# Patient Record
Sex: Female | Born: 1935 | Race: White | Hispanic: No | Smoking: Never smoker
Health system: Southern US, Community
[De-identification: ages and names within clinical notes are randomized; demographics above are authoritative.]

## PROBLEM LIST (undated history)

## (undated) DIAGNOSIS — D1800 Hemangioma unspecified site: Secondary | ICD-10-CM

## (undated) DIAGNOSIS — C801 Malignant (primary) neoplasm, unspecified: Secondary | ICD-10-CM

## (undated) DIAGNOSIS — Z8669 Personal history of other diseases of the nervous system and sense organs: Secondary | ICD-10-CM

## (undated) DIAGNOSIS — K449 Diaphragmatic hernia without obstruction or gangrene: Secondary | ICD-10-CM

## (undated) DIAGNOSIS — E538 Deficiency of other specified B group vitamins: Secondary | ICD-10-CM

## (undated) DIAGNOSIS — E7800 Pure hypercholesterolemia, unspecified: Secondary | ICD-10-CM

## (undated) DIAGNOSIS — M810 Age-related osteoporosis without current pathological fracture: Secondary | ICD-10-CM

## (undated) DIAGNOSIS — Z9889 Other specified postprocedural states: Secondary | ICD-10-CM

## (undated) HISTORY — DX: Other specified postprocedural states: Z98.890

## (undated) HISTORY — DX: Malignant (primary) neoplasm, unspecified: C80.1

## (undated) HISTORY — DX: Diaphragmatic hernia without obstruction or gangrene: K44.9

## (undated) HISTORY — PX: BREAST BIOPSY: SHX20

## (undated) HISTORY — PX: SKIN CANCER EXCISION: SHX779

## (undated) HISTORY — DX: Age-related osteoporosis without current pathological fracture: M81.0

## (undated) HISTORY — DX: Deficiency of other specified B group vitamins: E53.8

## (undated) HISTORY — DX: Pure hypercholesterolemia, unspecified: E78.00

## (undated) HISTORY — PX: EYE SURGERY: SHX253

## (undated) HISTORY — DX: Personal history of other diseases of the nervous system and sense organs: Z86.69

## (undated) HISTORY — DX: Hemangioma unspecified site: D18.00

---

## 1973-12-12 HISTORY — PX: ABDOMINAL HYSTERECTOMY: SHX81

## 1990-03-12 HISTORY — PX: CHOLECYSTECTOMY: SHX55

## 2005-10-03 ENCOUNTER — Ambulatory Visit: Payer: Self-pay | Admitting: Internal Medicine

## 2006-10-04 ENCOUNTER — Ambulatory Visit: Payer: Self-pay | Admitting: Internal Medicine

## 2007-12-27 ENCOUNTER — Ambulatory Visit: Payer: Self-pay | Admitting: Internal Medicine

## 2008-12-31 ENCOUNTER — Ambulatory Visit: Payer: Self-pay | Admitting: Internal Medicine

## 2009-03-07 ENCOUNTER — Emergency Department: Payer: Self-pay | Admitting: Emergency Medicine

## 2009-03-08 ENCOUNTER — Emergency Department: Payer: Self-pay | Admitting: Emergency Medicine

## 2009-03-16 ENCOUNTER — Ambulatory Visit: Payer: Self-pay | Admitting: Internal Medicine

## 2009-03-18 ENCOUNTER — Ambulatory Visit: Payer: Self-pay | Admitting: Internal Medicine

## 2009-04-15 ENCOUNTER — Ambulatory Visit: Payer: Self-pay | Admitting: Neurology

## 2009-08-21 ENCOUNTER — Ambulatory Visit: Payer: Self-pay | Admitting: Internal Medicine

## 2009-09-01 ENCOUNTER — Ambulatory Visit: Payer: Self-pay | Admitting: Internal Medicine

## 2009-09-11 ENCOUNTER — Ambulatory Visit: Payer: Self-pay | Admitting: Oncology

## 2009-09-25 ENCOUNTER — Ambulatory Visit: Payer: Self-pay | Admitting: Oncology

## 2009-10-12 ENCOUNTER — Ambulatory Visit: Payer: Self-pay | Admitting: Oncology

## 2010-01-07 LAB — HM DEXA SCAN

## 2010-01-19 ENCOUNTER — Ambulatory Visit: Payer: Self-pay | Admitting: Internal Medicine

## 2010-04-01 IMAGING — CT CT CHEST W/O CM
1 of 2 series · 14 of 32 positions shown, 18 images · non-contrast
Comparison: none

REASON FOR EXAM: abnormal chest xray  rounded density left lung eval lung
parenchyma  DR SPEC...
COMMENTS:

[Series 2: soft tissue · axial · 0.50mm/px · z∈[+274,+549]mm · 14 of 65 slices shown, 18 images]
[im 5/65  mediastinal]
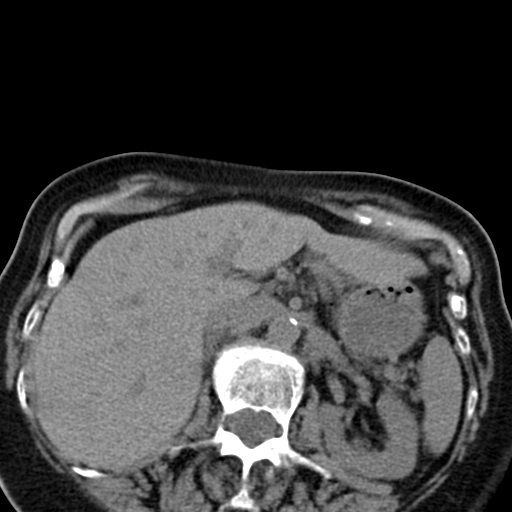
[im 5/65  lung]
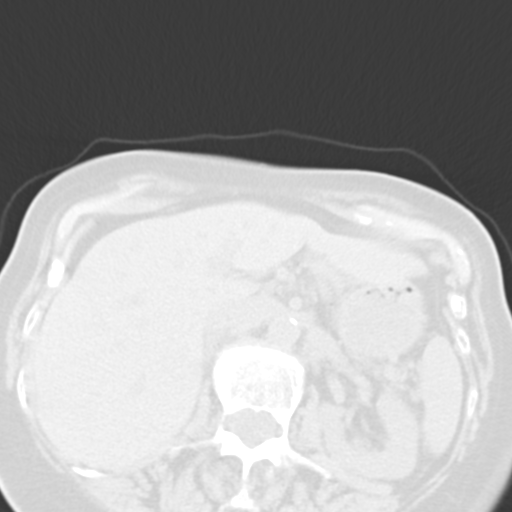
[im 10/65  lung]
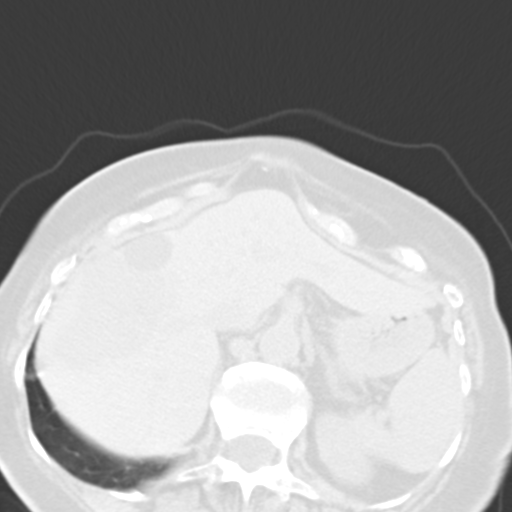
[im 15/65  lung]
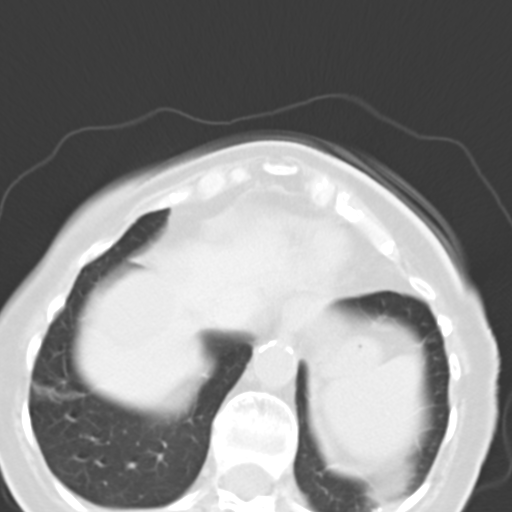
[im 20/65  lung]
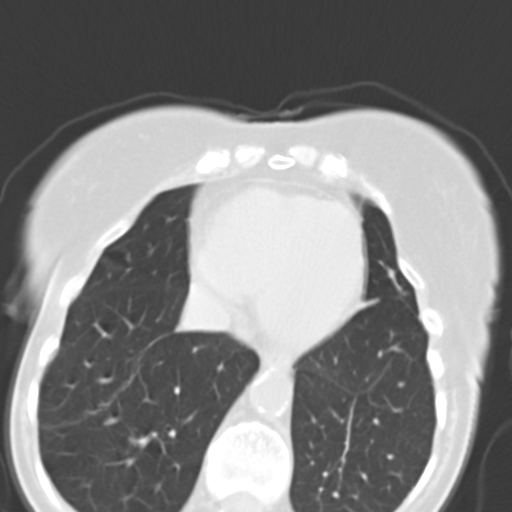
[im 25/65  mediastinal]
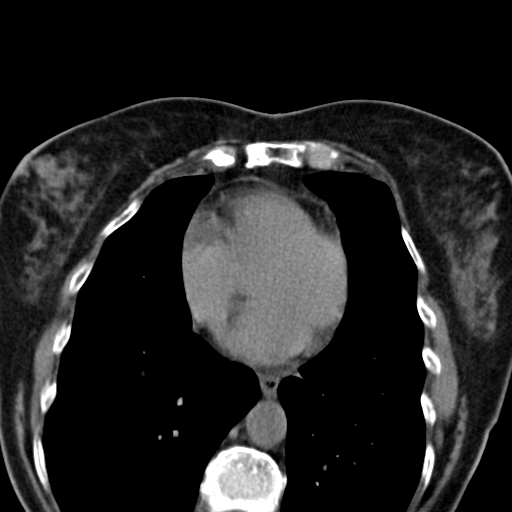
[im 25/65  lung]
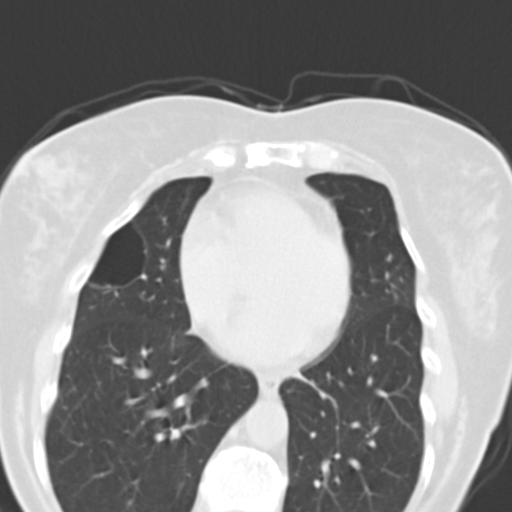
[im 30/65  lung]
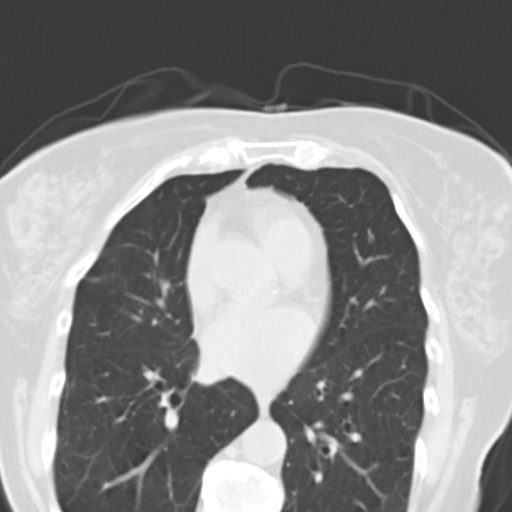
[im 31/65  lung]
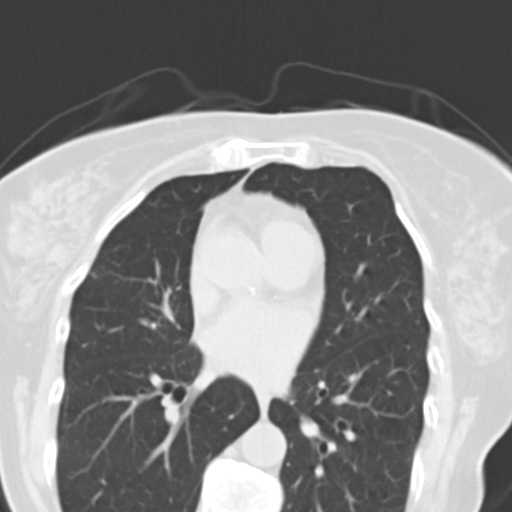
[im 33/65  lung]
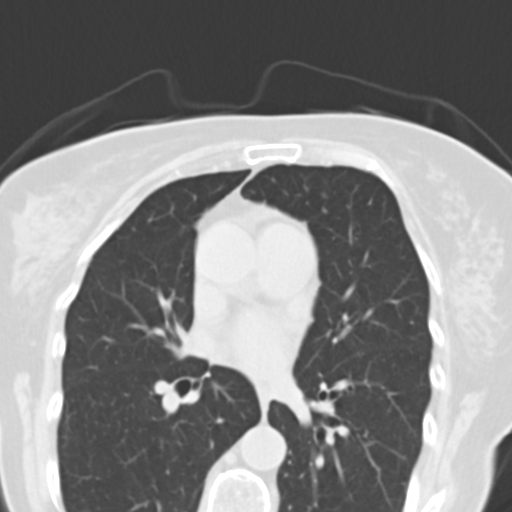
[im 35/65  mediastinal]
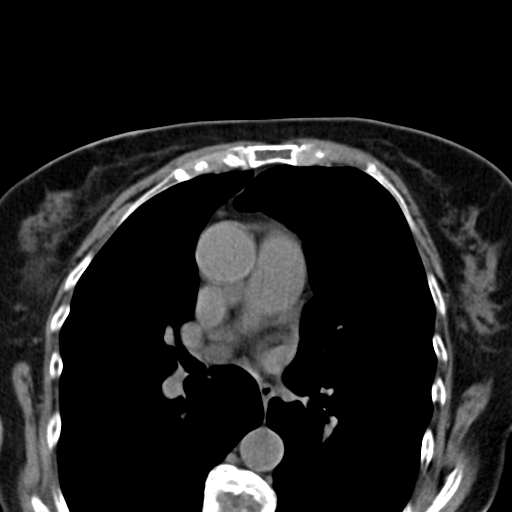
[im 35/65  lung]
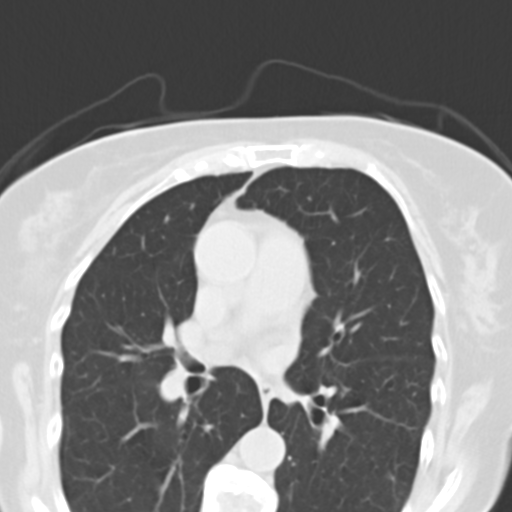
[im 40/65  lung]
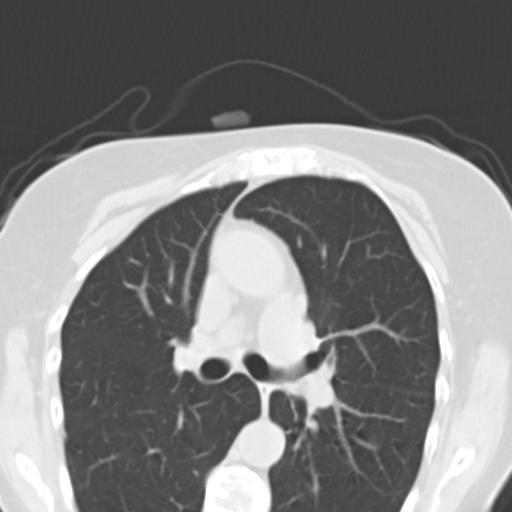
[im 45/65  lung]
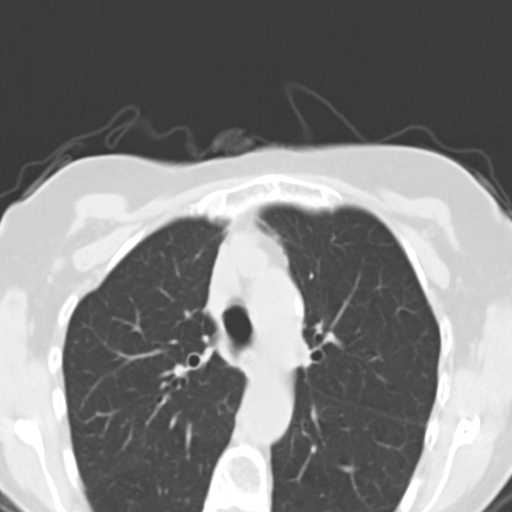
[im 50/65  lung]
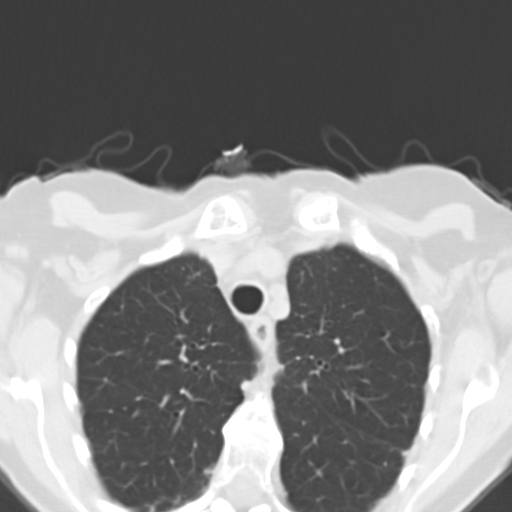
[im 55/65  mediastinal]
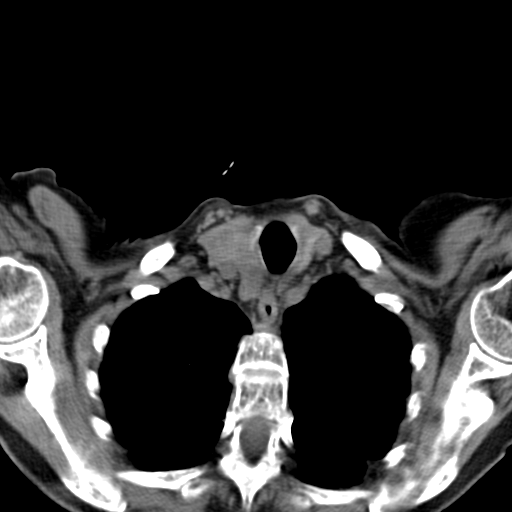
[im 55/65  lung]
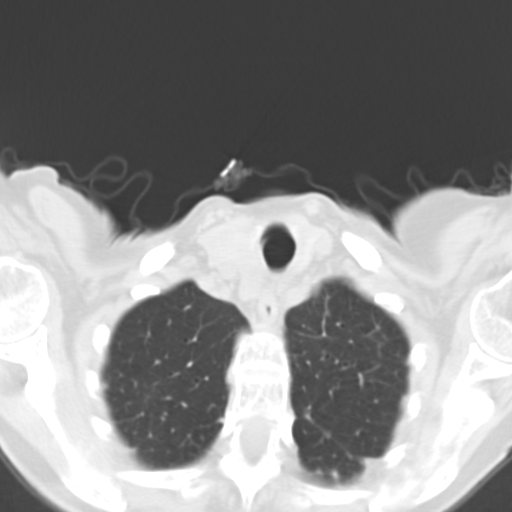
[im 60/65  lung]
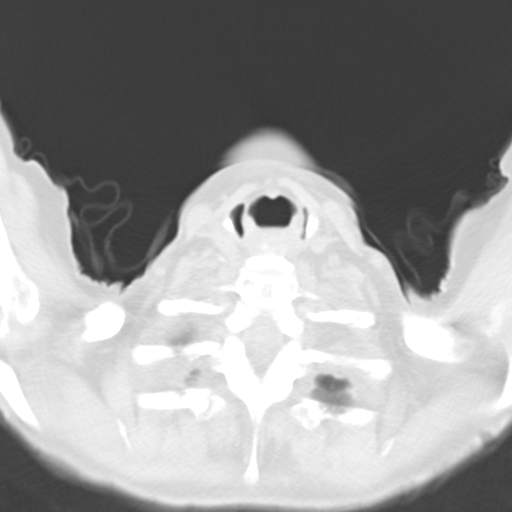

[14 of 32 positions shown; findings below may reference images not displayed]

PROCEDURE:     CT  - CT CHEST WITHOUT CONTRAST  - August 21, 2009 [DATE]

RESULT:     Axial CT scanning was performed through the chest without
contrast as requested. Review of 3-dimensional reconstructed images was
performed separately on the [employer] monitor.

At lung window settings there are emphysematous changes bilaterally. There
is apical pleural scarring and nodularity bilaterally. There are subtle
areas of nodularity associated with the major fissure on the left on image
28 and on the right along the major fissure on image 24. This latter nodule
is faintly calcified.

The cardiac chambers are normal in size. No pathologic sized mediastinal or
hilar lymph nodes are seen. The right thyroid lobe is enlarged. There is
prominent thoracic kyphosis.

Within the abdomen there are hypodensities in the right lobe of the liver. A
smaller lower density lesion present anteriorly measures 2.4 x 1.8 cm and
has Hounsfield measurement of 7. A larger less hypo-dense structure measures
5.6 x 5.5 cm and has Hounsfield measurement of approximately 35.
IMPRESSION: 1. There are emphysematous changes in both lungs. There are scattered
subcentimeter nodules some of which are calcified which are chiefly
associated with the pleural surfaces. There is apical pleural scarring as
well.
2. There is no evidence of CHF nor pneumonia. I see no lymphadenopathy.
3. There are nonspecific hypodensities within the liver for which further
evaluation is recommended. Ultrasound could be considered initially.
However, more definitive evaluation with a triphasic CT scan or MRI of the
liver would be useful.

## 2011-03-31 ENCOUNTER — Ambulatory Visit: Payer: Self-pay | Admitting: Internal Medicine

## 2012-04-04 ENCOUNTER — Ambulatory Visit: Payer: Self-pay | Admitting: Internal Medicine

## 2012-10-16 ENCOUNTER — Ambulatory Visit: Payer: Self-pay | Admitting: Internal Medicine

## 2012-10-16 ENCOUNTER — Encounter: Payer: Self-pay | Admitting: Internal Medicine

## 2012-10-16 NOTE — Progress Notes (Signed)
  Subjective:    Patient ID: Denise Garrison, female    DOB: 07/13/1923, 78 y.o.   MRN: [id number]  HPI    Review of Systems     Objective:   Physical Exam        Assessment & Plan:  Pt cancelled appt.  Pt information was abstracted into the system.

## 2012-10-30 ENCOUNTER — Encounter: Payer: Self-pay | Admitting: Internal Medicine

## 2012-10-30 ENCOUNTER — Ambulatory Visit (INDEPENDENT_AMBULATORY_CARE_PROVIDER_SITE_OTHER): Payer: PRIVATE HEALTH INSURANCE | Admitting: Internal Medicine

## 2012-10-30 VITALS — BP 130/70 | HR 82 | Temp 98.4°F | Ht 64.0 in | Wt 107.0 lb

## 2012-10-30 DIAGNOSIS — E7800 Pure hypercholesterolemia, unspecified: Secondary | ICD-10-CM

## 2012-10-30 DIAGNOSIS — F09 Unspecified mental disorder due to known physiological condition: Secondary | ICD-10-CM

## 2012-10-30 DIAGNOSIS — R634 Abnormal weight loss: Secondary | ICD-10-CM

## 2012-10-30 DIAGNOSIS — M810 Age-related osteoporosis without current pathological fracture: Secondary | ICD-10-CM

## 2012-10-30 DIAGNOSIS — R7309 Other abnormal glucose: Secondary | ICD-10-CM

## 2012-10-30 DIAGNOSIS — E538 Deficiency of other specified B group vitamins: Secondary | ICD-10-CM

## 2012-10-30 DIAGNOSIS — R739 Hyperglycemia, unspecified: Secondary | ICD-10-CM

## 2012-10-30 LAB — CBC WITH DIFFERENTIAL/PLATELET
Basophils Absolute: 0 10*3/uL (ref 0.0–0.1)
Eosinophils Absolute: 0 10*3/uL (ref 0.0–0.7)
HCT: 41.4 % (ref 36.0–46.0)
Lymphs Abs: 1.2 10*3/uL (ref 0.7–4.0)
MCHC: 31.8 g/dL (ref 30.0–36.0)
MCV: 88.4 fl (ref 78.0–100.0)
Monocytes Absolute: 0.6 10*3/uL (ref 0.1–1.0)
Platelets: 251 10*3/uL (ref 150.0–400.0)
RDW: 15 % — ABNORMAL HIGH (ref 11.5–14.6)

## 2012-10-30 LAB — COMPREHENSIVE METABOLIC PANEL
ALT: 13 U/L (ref 0–35)
Alkaline Phosphatase: 50 U/L (ref 39–117)
Creatinine, Ser: 0.7 mg/dL (ref 0.4–1.2)
Sodium: 139 mEq/L (ref 135–145)
Total Bilirubin: 0.5 mg/dL (ref 0.3–1.2)
Total Protein: 6.6 g/dL (ref 6.0–8.3)

## 2012-10-30 NOTE — Patient Instructions (Addendum)
It was nice seeing you today.  I am glad you have been feeling well.  Let me know if you need anything.

## 2012-10-31 ENCOUNTER — Telehealth: Payer: Self-pay | Admitting: Internal Medicine

## 2012-10-31 NOTE — Telephone Encounter (Signed)
Daughter in law called in and stated that patient is out of B12 medication and also would like for you to call her to discuss another medication that she was previously taken for memory she states patient didn't take like she was suppose to. Not sure if she should get that filled either.   Please advise.

## 2012-10-31 NOTE — Progress Notes (Signed)
Called and gave lab results to patient. Scheduled appointment for 9:00am 02/25/13.

## 2012-11-01 ENCOUNTER — Telehealth: Payer: Self-pay | Admitting: *Deleted

## 2012-11-01 NOTE — Telephone Encounter (Signed)
Spoke to patients daughter-n-law, she wanted to know if you wanted to continue to keep patient on b-12 inj. If so the patient will need a script called in for them. She also wanted to know if you would give patient a referral to Dermatologist, that patient saw at [facility] once before. She also wanted you to be aware that patient is know longer taking the medication that was prescribed for memory.

## 2012-11-05 NOTE — Telephone Encounter (Signed)
Opened by mistake.

## 2012-11-11 ENCOUNTER — Encounter: Payer: Self-pay | Admitting: Internal Medicine

## 2012-11-11 DIAGNOSIS — M810 Age-related osteoporosis without current pathological fracture: Secondary | ICD-10-CM | POA: Insufficient documentation

## 2012-11-11 DIAGNOSIS — E538 Deficiency of other specified B group vitamins: Secondary | ICD-10-CM | POA: Insufficient documentation

## 2012-11-11 DIAGNOSIS — F09 Unspecified mental disorder due to known physiological condition: Secondary | ICD-10-CM | POA: Insufficient documentation

## 2012-11-11 DIAGNOSIS — E7800 Pure hypercholesterolemia, unspecified: Secondary | ICD-10-CM | POA: Insufficient documentation

## 2012-11-11 NOTE — Assessment & Plan Note (Signed)
Continue monthly B12 injections

## 2012-11-11 NOTE — Progress Notes (Signed)
  Subjective:    Patient ID: Denise Garrison, female    DOB: Feb 03, 1923, 78 y.o.   MRN: [id number]  HPI 78 year old female with past history of hypercholesterolemia, malignant melanoma and B12 deficiency who comes in today for a scheduled follow up.  She states she feels she is doing relatively well.  Her son accompanies her.  History obtained from both of them.  On Aricept now.  He feels things have stabilized.  Eating and drinking regularly.  No bowel change.    Past Medical History  Diagnosis Date  . Hypercholesterolemia   . Cancer     history of melanoma, s/p excision x 3  . Hiatal hernia     History of  . Cavernous hemangioma     History of  . Hx of detached retina repair   . Osteoporosis   . B12 deficiency     Outpatient Encounter Prescriptions as of 10/30/2012  Medication Sig Dispense Refill  . cyanocobalamin (,VITAMIN B-12,) 1000 MCG/ML injection Inject 1,000 mcg into the muscle every 30 (thirty) days.      Marland Kitchen donepezil (ARICEPT) 5 MG tablet Take 5 mg by mouth daily.        Review of Systems Patient denies any headache, lightheadedness or dizziness.  No sinus or allergy symptoms.  No chest pain, tightness or palpaitations.  No increased shortness of breath, cough or congestion.  No nausea or vomiting.  No abdominal pain or cramping.  No bowel change, such as diarrhea, constipation, BRBPR or melana.  No urine change.   Son reports memory - stable.  Tolerating the Aricept.       Objective:   Physical Exam Filed Vitals:   10/30/12 1409  BP: 130/70  Pulse: 82  Temp: 98.4 F (38.36 C)   78 year old female in no acute distress.   HEENT:  Nares - clear.  OP- without lesions or erythema.  NECK:  Supple, nontender.  No audible bruit.   HEART:  Appears to be regular. LUNGS:  Without crackles or wheezing audible.  Respirations even and unlabored.   RADIAL PULSE:  Equal bilaterally.  ABDOMEN:  Soft, nontender.  No audible abdominal bruit.   EXTREMITIES:  No increased edema to  be present.                     Assessment & Plan:  ABNORMAL ABDOMINAL CT/US.  08/21/09 CT revealed scattered subcentimeter nodules chiefly associated with the pleural surfaces.  No lymphadenopathy.  There were nonspecific hypodensities within the liver.  Ultrasound 09/01/12 revealed a cystic appearing structure in the left lobe of the liver and hyperechoic solid appearing structures in the left kidney.  Recommended follow up triphasic CT or MRI.  Pt declined further evaluation and has continued to declline.  Currently asymptomatic.    INCREASED PSYCHOSOCIAL STRESSORS.  Stable.   CARDIOVASCULAR.  Stable.  Asymptomatic.    PULMONARY.  Asymptomatic.  WEIGHT LOSS.  Persistent weight loss.  She declines further testing/scanning.  She did agree to labs.   Will check cbc, met c and tsh.  Encouraged increased po intake.  Nutritional supplements.      HEALTH MAINTENANCE.  Physical 02/27/12.  Declined pelvic/rectal exam.  Mammogram 04/04/12 - BiRADS II.  Declines GI evaluation.  Flu shot given today.

## 2012-11-11 NOTE — Assessment & Plan Note (Signed)
On Aricept.  Son reports stable.  Follow.

## 2012-11-11 NOTE — Assessment & Plan Note (Signed)
Bone density 01/07/10 revealed osteoporosis with interval decrease.   Vitamin D level decreased.  Should be taking calcium and vitamin D.  Desires no further testing and no other medications.    

## 2012-11-11 NOTE — Assessment & Plan Note (Signed)
Follow lipid panel.  Will check with next fasting labs.   

## 2012-11-19 DIAGNOSIS — F09 Unspecified mental disorder due to known physiological condition: Secondary | ICD-10-CM

## 2013-02-06 ENCOUNTER — Ambulatory Visit (INDEPENDENT_AMBULATORY_CARE_PROVIDER_SITE_OTHER): Payer: PRIVATE HEALTH INSURANCE | Admitting: Internal Medicine

## 2013-02-06 ENCOUNTER — Encounter: Payer: Self-pay | Admitting: Internal Medicine

## 2013-02-06 VITALS — BP 140/64 | HR 80 | Temp 98.6°F | Ht 64.0 in | Wt 102.2 lb

## 2013-02-06 DIAGNOSIS — F09 Unspecified mental disorder due to known physiological condition: Secondary | ICD-10-CM

## 2013-02-06 DIAGNOSIS — E538 Deficiency of other specified B group vitamins: Secondary | ICD-10-CM

## 2013-02-06 DIAGNOSIS — R7309 Other abnormal glucose: Secondary | ICD-10-CM

## 2013-02-06 DIAGNOSIS — R5383 Other fatigue: Secondary | ICD-10-CM

## 2013-02-06 DIAGNOSIS — E7800 Pure hypercholesterolemia, unspecified: Secondary | ICD-10-CM

## 2013-02-06 DIAGNOSIS — M810 Age-related osteoporosis without current pathological fracture: Secondary | ICD-10-CM

## 2013-02-06 DIAGNOSIS — R739 Hyperglycemia, unspecified: Secondary | ICD-10-CM

## 2013-02-06 LAB — COMPREHENSIVE METABOLIC PANEL
BUN: 11 mg/dL (ref 6–23)
CO2: 30 mEq/L (ref 19–32)
Calcium: 9.2 mg/dL (ref 8.4–10.5)
Chloride: 101 mEq/L (ref 96–112)
Creatinine, Ser: 0.7 mg/dL (ref 0.4–1.2)
GFR: 82.63 mL/min (ref >60.00)
Glucose, Bld: 81 mg/dL (ref 70–99)
Total Bilirubin: 0.6 mg/dL (ref 0.3–1.2)

## 2013-02-06 LAB — CBC WITH DIFFERENTIAL/PLATELET
Basophils Relative: 0.3 % (ref 0.0–3.0)
HCT: 42.9 % (ref 36.0–46.0)
Hemoglobin: 13.8 g/dL (ref 12.0–15.0)
Lymphocytes Relative: 14.5 % (ref 12.0–46.0)
Lymphs Abs: 0.8 10*3/uL (ref 0.7–4.0)
Monocytes Relative: 8.8 % (ref 3.0–12.0)
Neutro Abs: 4.3 10*3/uL (ref 1.4–7.7)
RBC: 4.9 Mil/uL (ref 3.87–5.11)

## 2013-02-06 LAB — HEMOGLOBIN A1C: Hgb A1c MFr Bld: 6.1 % (ref 4.6–6.5)

## 2013-02-08 ENCOUNTER — Encounter: Payer: Self-pay | Admitting: Internal Medicine

## 2013-02-10 ENCOUNTER — Encounter: Payer: Self-pay | Admitting: Internal Medicine

## 2013-02-10 NOTE — Assessment & Plan Note (Signed)
Bone density 01/07/10 revealed osteoporosis with interval decrease.   Vitamin D level decreased.  Should be taking calcium and vitamin D.  Desires no further testing and no other medications.    

## 2013-02-10 NOTE — Assessment & Plan Note (Signed)
Continue monthly B12 injections

## 2013-02-10 NOTE — Assessment & Plan Note (Signed)
Continue Aricept.  Follow.

## 2013-02-10 NOTE — Progress Notes (Signed)
Subjective:    Patient ID: Denise Garrison, female    DOB: December 18, 1922, 79 y.o.   MRN: [id number]  HPI 79 year old female with past history of hypercholesterolemia, malignant melanoma and B12 deficiency who comes in today as a work in with concerns regarding a previous episode of feeling as if she was going to pass out.  She is accompanied by her daughter-n-law.  History obtained from both of them.  One episode occurred while she was standing making a sandwich.  Went to lie down and felt better.   Apparently had one other episode, but unsure what she was doing when this occurred.  She denies any significant dizziness or light headedness.  Not eating as much.  Losing weight.  No bowel change.     Past Medical History  Diagnosis Date  . Hypercholesterolemia   . Cancer     history of melanoma, s/p excision x 3  . Hiatal hernia     History of  . Cavernous hemangioma     History of  . Hx of detached retina repair   . Osteoporosis   . B12 deficiency     Outpatient Encounter Prescriptions as of 02/06/2013  Medication Sig Dispense Refill  . cyanocobalamin (,VITAMIN B-12,) 1000 MCG/ML injection Inject 1,000 mcg into the muscle every 30 (thirty) days.      Marland Kitchen donepezil (ARICEPT) 5 MG tablet Take 5 mg by mouth daily.       No facility-administered encounter medications on file as of 02/06/2013.    Review of Systems Patient denies any headache or significant lightheadedness or dizziness.  No sinus or allergy symptoms.  No chest pain, tightness or palpitations.  No increased shortness of breath, cough or congestion.  No nausea or vomiting.  No abdominal pain or cramping.  No bowel change, such as diarrhea, constipation, BRBPR or melana.  No urine change.   Decreased po intake.        Objective:   Physical Exam  Filed Vitals:   02/06/13 0828  BP: 140/64  Pulse: 80  Temp: 98.6 F (37 C)   Not orthostatic on exam.  (blood pressure 132/72).  79 year old female in no acute distress.   HEENT:   Nares - clear.  OP- without lesions or erythema.  NECK:  Supple, nontender.  No audible bruit.   HEART:  Appears to be regular. LUNGS:  Without crackles or wheezing audible.  Respirations even and unlabored.   RADIAL PULSE:  Equal bilaterally.  ABDOMEN:  Soft, nontender.  No audible abdominal bruit.   EXTREMITIES:  No increased edema to be present.                     Assessment & Plan:  NEAR SYNCOPE.  See above for details.  Encourage increased po intake.  Nutritional shakes and supplements.  Eat regular meals.  Discussed further w/up.  She declines.  Not orthostatic on exam.  Check metabolic panel and cbc.  Slow position changes and movements.  Follow.    ABNORMAL ABDOMINAL CT/US.  08/21/09 CT revealed scattered subcentimeter nodules chiefly associated with the pleural surfaces.  No lymphadenopathy.  There were nonspecific hypodensities within the liver.  Ultrasound 09/01/12 revealed a cystic appearing structure in the left lobe of the liver and hyperechoic solid appearing structures in the left kidney.  Recommended follow up triphasic CT or MRI.  Pt declined further evaluation and has continued to declline.  Currently asymptomatic.    INCREASED PSYCHOSOCIAL STRESSORS.  Stable.   CARDIOVASCULAR.  Stable.  Asymptomatic.    PULMONARY.  Asymptomatic.  WEIGHT LOSS.  Persistent weight loss.  She declines further testing/scanning.  Encouraged increased po intake.  Nutritional supplements.      HEALTH MAINTENANCE.  Physical 02/27/12.  Declined pelvic/rectal exam.  Mammogram 04/04/12 - BiRADS II.  Declines GI evaluation.

## 2013-02-10 NOTE — Assessment & Plan Note (Signed)
Follow lipid panel.  Will check with next fasting labs.   

## 2013-02-20 ENCOUNTER — Telehealth: Payer: Self-pay | Admitting: Internal Medicine

## 2013-02-20 NOTE — Telephone Encounter (Signed)
FYI

## 2013-02-20 NOTE — Telephone Encounter (Signed)
Asking if pt can have OT and social work come in due to pt having dementia.  Can take verbal consent  [phone].
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
## 2013-02-20 NOTE — Telephone Encounter (Signed)
noted 

## 2013-02-20 NOTE — Telephone Encounter (Signed)
ok 

## 2013-02-21 NOTE — Telephone Encounter (Signed)
Verbal ok given by Lorene Dy.

## 2013-02-25 ENCOUNTER — Other Ambulatory Visit: Payer: PRIVATE HEALTH INSURANCE

## 2013-02-28 ENCOUNTER — Encounter: Payer: PRIVATE HEALTH INSURANCE | Admitting: Internal Medicine

## 2013-04-03 DIAGNOSIS — R634 Abnormal weight loss: Secondary | ICD-10-CM

## 2013-04-03 DIAGNOSIS — R7309 Other abnormal glucose: Secondary | ICD-10-CM

## 2013-04-03 DIAGNOSIS — F09 Unspecified mental disorder due to known physiological condition: Secondary | ICD-10-CM

## 2014-03-07 ENCOUNTER — Encounter: Payer: Self-pay | Admitting: Internal Medicine

## 2014-03-07 ENCOUNTER — Ambulatory Visit (INDEPENDENT_AMBULATORY_CARE_PROVIDER_SITE_OTHER): Payer: Medicare Other | Admitting: Internal Medicine

## 2014-03-07 VITALS — BP 122/60 | HR 77 | Temp 98.4°F | Ht 64.0 in | Wt 115.0 lb

## 2014-03-07 DIAGNOSIS — E7800 Pure hypercholesterolemia, unspecified: Secondary | ICD-10-CM

## 2014-03-07 DIAGNOSIS — R739 Hyperglycemia, unspecified: Secondary | ICD-10-CM

## 2014-03-07 DIAGNOSIS — F09 Unspecified mental disorder due to known physiological condition: Secondary | ICD-10-CM

## 2014-03-07 DIAGNOSIS — M810 Age-related osteoporosis without current pathological fracture: Secondary | ICD-10-CM

## 2014-03-07 DIAGNOSIS — E0789 Other specified disorders of thyroid: Secondary | ICD-10-CM

## 2014-03-07 DIAGNOSIS — R7309 Other abnormal glucose: Secondary | ICD-10-CM

## 2014-03-07 DIAGNOSIS — E538 Deficiency of other specified B group vitamins: Secondary | ICD-10-CM

## 2014-03-07 DIAGNOSIS — R5383 Other fatigue: Secondary | ICD-10-CM

## 2014-03-07 DIAGNOSIS — R5381 Other malaise: Secondary | ICD-10-CM

## 2014-03-07 LAB — COMPREHENSIVE METABOLIC PANEL
ALT: 9 U/L (ref 0–35)
AST: 10 U/L (ref 0–37)
Albumin: 4 g/dL (ref 3.5–5.2)
Alkaline Phosphatase: 58 U/L (ref 39–117)
BUN: 14 mg/dL (ref 6–23)
CALCIUM: 9.3 mg/dL (ref 8.4–10.5)
CO2: 30 meq/L (ref 19–32)
CREATININE: 0.78 mg/dL (ref 0.50–1.10)
Chloride: 102 mEq/L (ref 96–112)
Glucose, Bld: 116 mg/dL — ABNORMAL HIGH (ref 70–99)
Potassium: 4.4 mEq/L (ref 3.5–5.3)
Sodium: 141 mEq/L (ref 135–145)
Total Bilirubin: 0.4 mg/dL (ref 0.2–1.2)
Total Protein: 6.1 g/dL (ref 6.0–8.3)

## 2014-03-07 LAB — CBC WITH DIFFERENTIAL/PLATELET
Basophils Absolute: 0 10*3/uL (ref 0.0–0.1)
Basophils Relative: 0 % (ref 0–1)
Eosinophils Absolute: 0.1 10*3/uL (ref 0.0–0.7)
Eosinophils Relative: 1 % (ref 0–5)
HEMATOCRIT: 41.7 % (ref 36.0–46.0)
HEMOGLOBIN: 13.6 g/dL (ref 12.0–15.0)
LYMPHS ABS: 1.3 10*3/uL (ref 0.7–4.0)
Lymphocytes Relative: 17 % (ref 12–46)
MCH: 27.6 pg (ref 26.0–34.0)
MCHC: 32.6 g/dL (ref 30.0–36.0)
MCV: 84.6 fL (ref 78.0–100.0)
MONOS PCT: 10 % (ref 3–12)
Monocytes Absolute: 0.7 10*3/uL (ref 0.1–1.0)
NEUTROS ABS: 5.3 10*3/uL (ref 1.7–7.7)
Neutrophils Relative %: 72 % (ref 43–77)
Platelets: 273 10*3/uL (ref 150–400)
RBC: 4.93 MIL/uL (ref 3.87–5.11)
RDW: 14.4 % (ref 11.5–15.5)
WBC: 7.4 10*3/uL (ref 4.0–10.5)

## 2014-03-07 MED ORDER — CYANOCOBALAMIN 1000 MCG/ML IJ SOLN
1000.0000 ug | Freq: Once | INTRAMUSCULAR | Status: AC
  Administered 2014-03-07: 1000 ug via INTRAMUSCULAR

## 2014-03-07 NOTE — Progress Notes (Signed)
Pre-visit discussion using our clinic review tool. No additional management support is needed unless otherwise documented below in the visit note.  

## 2014-03-07 NOTE — Progress Notes (Signed)
  Subjective:    Patient ID: Denise Garrison, female    DOB: 21-Aug-1923, 80 y.o.   MRN: [id number]  Extremity Weakness   80 year old female with past history of hypercholesterolemia, malignant melanoma and B12 deficiency who comes in today for a scheduled follow up.  She is accompanied by her daughter.   History obtained from both of them.  They feel she is doing relatively well.  Weight is up.  She is now living in [place].  Has three meals a day provided.  Interacts with other residence.  No chest pain or tightness.  No sob.  No nausea or vomiting.  Bowels stable.       Past Medical History  Diagnosis Date  . Hypercholesterolemia   . Cancer     history of melanoma, s/p excision x 3  . Hiatal hernia     History of  . Cavernous hemangioma     History of  . Hx of detached retina repair   . Osteoporosis   . B12 deficiency     Outpatient Encounter Prescriptions as of 03/07/2014  Medication Sig  . cyanocobalamin (,VITAMIN B-12,) 1000 MCG/ML injection Inject 1,000 mcg into the muscle every 30 (thirty) days.  Marland Kitchen donepezil (ARICEPT) 5 MG tablet Take 5 mg by mouth daily.    Review of Systems  Musculoskeletal: Positive for extremity weakness.  Patient denies any headache or significant lightheadedness or dizziness.  No sinus or allergy symptoms.  No chest pain, tightness or palpitations.  No increased shortness of breath, cough or congestion.  No nausea or vomiting.  No abdominal pain or cramping.  No bowel change, such as diarrhea, constipation, BRBPR or melana.  No urine change.   Eating and drinking well.  Weight is up.        Objective:   Physical Exam  Filed Vitals:   03/07/14 1450  BP: 122/60  Pulse: 77  Temp: 98.4 F (36.9 C)   Blood pressure recheck:  45/31  80 year old female in no acute distress.   HEENT:  Nares - clear.  OP- without lesions or erythema.  NECK:  Supple, nontender.  No audible bruit.  Enlarged thyroid - right.   HEART:  Appears to be  regular. LUNGS:  Without crackles or wheezing audible.  Respirations even and unlabored.   RADIAL PULSE:  Equal bilaterally.  ABDOMEN:  Soft, nontender.  No audible abdominal bruit.   EXTREMITIES:  No increased edema to be present.                     Assessment & Plan:  NEAR SYNCOPE.  No further episodes.  Follow.     ABNORMAL ABDOMINAL CT/US.  08/21/09 CT revealed scattered subcentimeter nodules chiefly associated with the pleural surfaces.  No lymphadenopathy.  There were nonspecific hypodensities within the liver.  Ultrasound 09/01/12 revealed a cystic appearing structure in the left lobe of the liver and hyperechoic solid appearing structures in the left kidney.  Recommended follow up triphasic CT or MRI.  Pt declined further evaluation and has continued to declline.  Currently asymptomatic.    INCREASED PSYCHOSOCIAL STRESSORS.  Stable.   CARDIOVASCULAR.  Stable.  Asymptomatic.    PULMONARY.  Asymptomatic.  HEALTH MAINTENANCE.  Physical 02/27/12.  Declined pelvic/rectal exam.  Mammogram 04/04/12 - BiRADS II.  Declines GI evaluation.   I spent 25 minutes with the patient and more than 50% of the time was spent in consultation regarding the above.

## 2014-03-08 LAB — HEMOGLOBIN A1C
HEMOGLOBIN A1C: 6 % — AB (ref <5.7)
Mean Plasma Glucose: 126 mg/dL — ABNORMAL HIGH (ref <117)

## 2014-03-08 LAB — VITAMIN B12: Vitamin B-12: 430 pg/mL (ref 211–911)

## 2014-03-09 ENCOUNTER — Encounter: Payer: Self-pay | Admitting: Internal Medicine

## 2014-03-09 DIAGNOSIS — E0789 Other specified disorders of thyroid: Secondary | ICD-10-CM | POA: Insufficient documentation

## 2014-03-09 NOTE — Assessment & Plan Note (Signed)
Bone density 01/07/10 revealed osteoporosis with interval decrease.   Vitamin D level decreased.  Should be taking calcium and vitamin D.  Desires no further testing and no other medications.

## 2014-03-09 NOTE — Assessment & Plan Note (Signed)
Continue monthly B12 injections

## 2014-03-09 NOTE — Assessment & Plan Note (Signed)
Off Aricept.  Follow.

## 2014-03-09 NOTE — Assessment & Plan Note (Signed)
Exam as outlined.  Check thyroid ultrasound.  Check thyroid function test.

## 2014-03-09 NOTE — Assessment & Plan Note (Signed)
Follow lipid panel.  Will check with next fasting labs.

## 2014-03-11 LAB — TSH: TSH: 0.827 u[IU]/mL (ref 0.350–4.500)

## 2014-03-12 ENCOUNTER — Ambulatory Visit: Payer: Self-pay | Admitting: Internal Medicine

## 2014-03-17 ENCOUNTER — Telehealth: Payer: Self-pay | Admitting: Internal Medicine

## 2014-03-17 ENCOUNTER — Encounter: Payer: Self-pay | Admitting: *Deleted

## 2014-03-17 NOTE — Telephone Encounter (Signed)
States when results are back for x-ray done last week they need to be called to Schuyler Amor at work # [facility]). States if they are called to the pt she will not remember.  Also needs to know if pt needs to keep upcoming appt on 4/28 or if they can cancel.

## 2014-03-18 NOTE — Telephone Encounter (Signed)
Daughter in law Pamala Hurry) notified & info faxed to Dr. Joycie Peek office

## 2014-03-18 NOTE — Telephone Encounter (Signed)
Tried to call [place].  Left message.  Thyroid ultrasound revealed two large nodules - right thyroid lobe.  Also nodule - left lobe.  Given size - needs biopsy.  Pamala Hurry works for Dr Gabriel Carina.  Can schedule appt with her if they are ok with this.  Let me know and I will place the order for the referral.

## 2014-03-19 NOTE — Telephone Encounter (Signed)
Noted  

## 2014-03-19 NOTE — Telephone Encounter (Signed)
Denise Garrison called back to inform you that Dr. Gabriel Carina states that she could do the biopsy there easily. However, after discussing the situation with her husband and family, they have decided to do nothing about it. Based on her dementia & her unwillingness to cooperate. Denise Garrison also cancelled her [name] f/u appt on 4/28, & states that they will just keep the one in September.

## 2014-04-08 ENCOUNTER — Ambulatory Visit: Payer: PRIVATE HEALTH INSURANCE | Admitting: Internal Medicine

## 2014-05-14 ENCOUNTER — Encounter: Payer: Self-pay | Admitting: Internal Medicine

## 2014-06-18 ENCOUNTER — Telehealth: Payer: Self-pay | Admitting: Internal Medicine

## 2014-06-18 MED ORDER — CYANOCOBALAMIN 1000 MCG/ML IJ SOLN: 1000.0000 ug | INTRAMUSCULAR | Status: DC

## 2014-06-18 NOTE — Telephone Encounter (Signed)
Refilled B12 (10 ml bottle).

## 2014-06-18 NOTE — Telephone Encounter (Signed)
Needing a prescription sent to [facility] for B-12 injections. If the physician still wants her to have them done at home.

## 2014-07-08 ENCOUNTER — Encounter: Payer: Medicare Other | Admitting: Internal Medicine

## 2014-09-03 ENCOUNTER — Encounter: Payer: Medicare Other | Admitting: Internal Medicine

## 2014-09-03 DIAGNOSIS — Z0289 Encounter for other administrative examinations: Secondary | ICD-10-CM

## 2014-09-08 ENCOUNTER — Ambulatory Visit: Payer: Medicare Other | Admitting: Internal Medicine

## 2014-09-08 ENCOUNTER — Ambulatory Visit (INDEPENDENT_AMBULATORY_CARE_PROVIDER_SITE_OTHER): Payer: Medicare Other | Admitting: Internal Medicine

## 2014-09-08 ENCOUNTER — Encounter: Payer: Self-pay | Admitting: Internal Medicine

## 2014-09-08 VITALS — BP 110/52 | HR 77 | Temp 98.3°F | Wt 120.0 lb

## 2014-09-08 DIAGNOSIS — B3731 Acute candidiasis of vulva and vagina: Secondary | ICD-10-CM

## 2014-09-08 DIAGNOSIS — N899 Noninflammatory disorder of vagina, unspecified: Secondary | ICD-10-CM

## 2014-09-08 DIAGNOSIS — N898 Other specified noninflammatory disorders of vagina: Secondary | ICD-10-CM

## 2014-09-08 DIAGNOSIS — B373 Candidiasis of vulva and vagina: Secondary | ICD-10-CM

## 2014-09-08 LAB — POCT URINALYSIS DIPSTICK
Bilirubin, UA: NEGATIVE
Blood, UA: NEGATIVE
Glucose, UA: NEGATIVE
Ketones, UA: NEGATIVE
Nitrite, UA: NEGATIVE
PROTEIN UA: NEGATIVE
Spec Grav, UA: 1.025
Urobilinogen, UA: NEGATIVE
pH, UA: 6

## 2014-09-08 MED ORDER — FLUCONAZOLE 150 MG PO TABS: 150.0000 mg | ORAL_TABLET | Freq: Once | ORAL | Status: DC

## 2014-09-08 NOTE — Progress Notes (Signed)
Pre visit review using our clinic review tool, if applicable. No additional management support is needed unless otherwise documented below in the visit note. 

## 2014-09-08 NOTE — Patient Instructions (Signed)

## 2014-09-08 NOTE — Progress Notes (Signed)
Subjective:    Patient ID: Denise Garrison, female    DOB: December 05, 1923, 80 y.o.   MRN: [id number]  HPI  Pt presents to the clinic today with c/o vaginal irritation and dysuria. She reports that she had dysuria 2 days ago, but that has seemed to resolve. The vaginal irritation started about 1 week ago. She feels raw in her vaginal area. She denies itching, odor or discharge.Her bowel and bladder are normal. She has been pushing fluids. She did have a hysterectomy in 1975.  Review of Systems  Past Medical History  Diagnosis Date  . Hypercholesterolemia   . Cancer     history of melanoma, s/p excision x 3  . Hiatal hernia     History of  . Cavernous hemangioma     History of  . Hx of detached retina repair   . Osteoporosis   . B12 deficiency     Current Outpatient Prescriptions  Medication Sig Dispense Refill  . cyanocobalamin (,VITAMIN B-12,) 1000 MCG/ML injection Inject 1 mL (1,000 mcg total) into the muscle every 30 (thirty) days.  10 mL  0   No current facility-administered medications for this visit.    No Known Allergies  Family History  Problem Relation Age of Onset  . Kidney disease Father   . Heart disease Father   . Breast cancer Neg Hx   . Colon cancer Neg Hx   . Kidney disease Brother   . Parkinson's disease Brother   . Melanoma Brother     History   Social History  . Marital Status: Widowed    Spouse Name: N/A    Number of Children: 2  . Years of Education: N/A   Occupational History  . Not on file.   Social History Main Topics  . Smoking status: Never Smoker   . Smokeless tobacco: Never Used  . Alcohol Use: Yes     Comment: occasional wine, rare  . Drug Use: No  . Sexual Activity: Not on file   Other Topics Concern  . Not on file   Social History Narrative   She is a widow ,has two children one son and one daughter.  She resided at [place]: Denies fever, malaise, fatigue, headache or abrupt weight changes.    Gastrointestinal: Denies abdominal pain, bloating, constipation, diarrhea or blood in the stool.  GU: Pt reports dysuria and vaginal irritation. Denies urgency, frequency, burning sensation, blood in urine, odor or discharge.   No other specific complaints in a complete review of systems (except as listed in HPI above).     Objective:   Physical Exam   BP 110/52  Pulse 77  Temp(Src) 98.3 F (36.8 C) (Oral)  Wt 120 lb (54.432 kg)  SpO2 97% Wt Readings from Last 3 Encounters:  09/08/14 120 lb (54.432 kg)  03/07/14 115 lb (52.164 kg)  02/06/13 102 lb 4 oz (46.38 kg)    General: Appears her stated age, well developed, well nourished in NAD. Cardiovascular: Normal rate and rhythm. S1,S2 noted.  No murmur, rubs or gallops noted.  Pulmonary/Chest: Normal effort and positive vesicular breath sounds. No respiratory distress. No wheezes, rales or ronchi noted.  Genitourinary: Normal female anatomy. Rectocele noted. Inner labia slightly red and irritated.  BMET    Component Value Date/Time   NA 141 03/07/2014 1554   K 4.4 03/07/2014 1554   CL 102 03/07/2014 1554   CO2 30 03/07/2014 1554   GLUCOSE 116* 03/07/2014  1554   BUN 14 03/07/2014 1554   CREATININE 0.78 03/07/2014 1554   CREATININE 0.7 02/06/2013 0948   CALCIUM 9.3 03/07/2014 1554    Lipid Panel  No results found for this basename: chol, trig, hdl, cholhdl, vldl, ldlcalc    CBC    Component Value Date/Time   WBC 7.4 03/07/2014 1554   RBC 4.93 03/07/2014 1554   HGB 13.6 03/07/2014 1554   HCT 41.7 03/07/2014 1554   PLT 273 03/07/2014 1554   MCV 84.6 03/07/2014 1554   MCH 27.6 03/07/2014 1554   MCHC 32.6 03/07/2014 1554   RDW 14.4 03/07/2014 1554   LYMPHSABS 1.3 03/07/2014 1554   MONOABS 0.7 03/07/2014 1554   EOSABS 0.1 03/07/2014 1554   BASOSABS 0.0 03/07/2014 1554    Hgb A1C Lab Results  Component Value Date   HGBA1C 6.0* 03/07/2014        Assessment & Plan:  Vaginal irritation secondary to candida:  Urinalysis: trace  leuks Wet prep: pos yeast, negative clue cells, negative trich eRx for Diflucan 150 mg x 1  RTC as needed or if symptoms persist or worsen

## 2014-12-18 ENCOUNTER — Other Ambulatory Visit: Payer: Self-pay | Admitting: Internal Medicine

## 2016-01-25 DIAGNOSIS — J069 Acute upper respiratory infection, unspecified: Secondary | ICD-10-CM | POA: Diagnosis not present

## 2016-09-10 DIAGNOSIS — N390 Urinary tract infection, site not specified: Secondary | ICD-10-CM | POA: Diagnosis not present

## 2016-12-03 DIAGNOSIS — R300 Dysuria: Secondary | ICD-10-CM | POA: Diagnosis not present

## 2016-12-03 DIAGNOSIS — N390 Urinary tract infection, site not specified: Secondary | ICD-10-CM | POA: Diagnosis not present

## 2016-12-19 ENCOUNTER — Ambulatory Visit: Payer: Self-pay

## 2016-12-29 ENCOUNTER — Ambulatory Visit: Payer: Self-pay

## 2017-01-03 ENCOUNTER — Ambulatory Visit: Payer: Self-pay | Admitting: Internal Medicine

## 2017-01-06 ENCOUNTER — Ambulatory Visit (INDEPENDENT_AMBULATORY_CARE_PROVIDER_SITE_OTHER): Payer: PPO | Admitting: Internal Medicine

## 2017-01-06 ENCOUNTER — Encounter: Payer: Self-pay | Admitting: Internal Medicine

## 2017-01-06 VITALS — BP 118/58 | HR 92 | Temp 98.0°F | Resp 18 | Wt 112.2 lb

## 2017-01-06 DIAGNOSIS — I4891 Unspecified atrial fibrillation: Secondary | ICD-10-CM

## 2017-01-06 DIAGNOSIS — M810 Age-related osteoporosis without current pathological fracture: Secondary | ICD-10-CM

## 2017-01-06 DIAGNOSIS — R000 Tachycardia, unspecified: Secondary | ICD-10-CM | POA: Diagnosis not present

## 2017-01-06 DIAGNOSIS — E538 Deficiency of other specified B group vitamins: Secondary | ICD-10-CM | POA: Diagnosis not present

## 2017-01-06 DIAGNOSIS — E7800 Pure hypercholesterolemia, unspecified: Secondary | ICD-10-CM | POA: Diagnosis not present

## 2017-01-06 DIAGNOSIS — E0789 Other specified disorders of thyroid: Secondary | ICD-10-CM | POA: Diagnosis not present

## 2017-01-06 DIAGNOSIS — F09 Unspecified mental disorder due to known physiological condition: Secondary | ICD-10-CM

## 2017-01-06 LAB — CBC WITH DIFFERENTIAL/PLATELET
BASOS PCT: 0.4 % (ref 0.0–3.0)
Basophils Absolute: 0 10*3/uL (ref 0.0–0.1)
EOS ABS: 0 10*3/uL (ref 0.0–0.7)
Eosinophils Relative: 0.6 % (ref 0.0–5.0)
HCT: 44.2 % (ref 36.0–46.0)
Hemoglobin: 14.4 g/dL (ref 12.0–15.0)
Lymphocytes Relative: 19.8 % (ref 12.0–46.0)
Lymphs Abs: 1.4 10*3/uL (ref 0.7–4.0)
MCHC: 32.6 g/dL (ref 30.0–36.0)
MCV: 86.7 fl (ref 78.0–100.0)
MONO ABS: 0.5 10*3/uL (ref 0.1–1.0)
Monocytes Relative: 7.8 % (ref 3.0–12.0)
NEUTROS ABS: 5 10*3/uL (ref 1.4–7.7)
Neutrophils Relative %: 71.4 % (ref 43.0–77.0)
PLATELETS: 271 10*3/uL (ref 150.0–400.0)
RBC: 5.1 Mil/uL (ref 3.87–5.11)
RDW: 15.3 % (ref 11.5–15.5)
WBC: 7 10*3/uL (ref 4.0–10.5)

## 2017-01-06 LAB — COMPREHENSIVE METABOLIC PANEL
ALT: 8 U/L (ref 0–35)
AST: 12 U/L (ref 0–37)
Albumin: 4.2 g/dL (ref 3.5–5.2)
Alkaline Phosphatase: 54 U/L (ref 39–117)
BUN: 10 mg/dL (ref 6–23)
CALCIUM: 9.2 mg/dL (ref 8.4–10.5)
CHLORIDE: 103 meq/L (ref 96–112)
CO2: 30 meq/L (ref 19–32)
CREATININE: 0.75 mg/dL (ref 0.40–1.20)
GFR: 76.88 mL/min (ref >60.00)
Glucose, Bld: 95 mg/dL (ref 70–99)
Potassium: 4.4 mEq/L (ref 3.5–5.1)
SODIUM: 141 meq/L (ref 135–145)
Total Bilirubin: 0.4 mg/dL (ref 0.2–1.2)
Total Protein: 6.2 g/dL (ref 6.0–8.3)

## 2017-01-06 LAB — VITAMIN D 25 HYDROXY (VIT D DEFICIENCY, FRACTURES): VITD: 13.56 ng/mL — AB (ref 30.00–100.00)

## 2017-01-06 LAB — TSH: TSH: 0.9 u[IU]/mL (ref 0.35–4.50)

## 2017-01-06 LAB — VITAMIN B12: VITAMIN B 12: 270 pg/mL (ref 211–911)

## 2017-01-06 NOTE — Patient Instructions (Signed)
Appointment with cardiology - 3:30 (01/10/17)

## 2017-01-06 NOTE — Progress Notes (Signed)
Pre visit review using our clinic review tool, if applicable. No additional management support is needed unless otherwise documented below in the visit note. 

## 2017-01-06 NOTE — Progress Notes (Signed)
Patient ID: Denise Garrison, female   DOB: 1923-05-23, 83 y.o.   MRN: [id number]   Subjective:    Patient ID: Denise Garrison, female    DOB: 30-Jul-1923, 83 y.o.   MRN: [id number]  HPI  Patient here for a scheduled f/u.  I have not seen her since 2015.  She is accompanied by her daughter.  History obtained from both of them.  She is currently living at [facility].  Recently treated for presumed uti with cipro.  Off medication now.  No urinary symptoms.  She is eating.  No nausea or vomiting.  No chest pain.  Denies any sob or increased heart rate or palpitations.  Breathing stable.  No abdominal pain.  Bowels stable.     Past Medical History:  Diagnosis Date  . B12 deficiency   . Cancer [facility])    history of melanoma, s/p excision x 3  . Cavernous hemangioma    History of  . Hiatal hernia    History of  . Hx of detached retina repair   . Hypercholesterolemia   . Osteoporosis    Past Surgical History:  Procedure Laterality Date  . ABDOMINAL HYSTERECTOMY  1975   Secondary to fibroid tumors  . BREAST BIOPSY     Benign  . CHOLECYSTECTOMY  4/91  . EYE SURGERY     Bilateral Cataract   . SKIN CANCER EXCISION     Malignant Melanoma x3   Family History  Problem Relation Age of Onset  . Kidney disease Father   . Heart disease Father   . Kidney disease Brother   . Parkinson's disease Brother   . Melanoma Brother   . Breast cancer Neg Hx   . Colon cancer Neg Hx    Social History   Social History  . Marital status: Widowed    Spouse name: N/A  . Number of children: 2  . Years of education: N/A   Social History Main Topics  . Smoking status: Never Smoker  . Smokeless tobacco: Never Used  . Alcohol use Yes     Comment: occasional wine, rare  . Drug use: No  . Sexual activity: Not Asked   Other Topics Concern  . None   Social History Narrative   She is a widow ,has two children one son and one daughter.  She resided at [facility] Encounter Prescriptions  as of 01/06/2017  Medication Sig  . cyanocobalamin (,VITAMIN B-12,) 1000 MCG/ML injection INJECT 1 ML (1,000 MCG TOTAL) INTO THE MUSCLE EVERY THIRTY DAYS (Patient not taking: Reported on 01/06/2017)  . fluconazole (DIFLUCAN) 150 MG tablet Take 1 tablet (150 mg total) by mouth once. (Patient not taking: Reported on 01/06/2017)   No facility-administered encounter medications on file as of 01/06/2017.     Review of Systems  Constitutional: Negative for appetite change and unexpected weight change.  HENT: Negative for congestion and sinus pressure.   Respiratory: Negative for cough, chest tightness and shortness of breath.   Cardiovascular: Negative for chest pain, palpitations and leg swelling.  Gastrointestinal: Negative for abdominal pain, diarrhea, nausea and vomiting.  Genitourinary: Negative for difficulty urinating and dysuria.  Musculoskeletal: Negative for back pain and joint swelling.  Skin: Negative for color change and rash.  Neurological: Negative for dizziness, light-headedness and headaches.  Psychiatric/Behavioral: Negative for agitation and dysphoric mood.       Objective:     Pulse rechecked by me:  100 Physical Exam  Constitutional: She  appears well-developed and well-nourished. No distress.  HENT:  Nose: Nose normal.  Mouth/Throat: Oropharynx is clear and moist.  Neck: Neck supple.  Thyroid fullness.    Cardiovascular: Normal rate and regular rhythm.   Pulmonary/Chest: Breath sounds normal. No respiratory distress. She has no wheezes.  Abdominal: Soft. Bowel sounds are normal. There is no tenderness.  Musculoskeletal: She exhibits no edema or tenderness.  Lymphadenopathy:    She has no cervical adenopathy.  Skin: No rash noted. No erythema.  Psychiatric: She has a normal mood and affect. Her behavior is normal.    BP (!) 118/58 (BP Location: Left Arm, Patient Position: Sitting, Cuff Size: Normal)   Pulse 92   Temp 98 F (36.7 C) (Oral)   Resp 18   Wt 112 lb  4 oz (50.9 kg)   SpO2 97%   BMI 19.27 kg/m  Wt Readings from Last 3 Encounters:  01/06/17 112 lb 4 oz (50.9 kg)  09/08/14 120 lb (54.4 kg)  03/07/14 115 lb (52.2 kg)     Lab Results  Component Value Date   WBC 7.0 01/06/2017   HGB 14.4 01/06/2017   HCT 44.2 01/06/2017   PLT 271.0 01/06/2017   GLUCOSE 95 01/06/2017   ALT 8 01/06/2017   AST 12 01/06/2017   NA 141 01/06/2017   K 4.4 01/06/2017   CL 103 01/06/2017   CREATININE 0.75 01/06/2017   BUN 10 01/06/2017   CO2 30 01/06/2017   TSH 0.90 01/06/2017   HGBA1C 6.0 (H) 03/07/2014        Assessment & Plan:   Problem List Items Addressed This Visit    A-fib ([place])    Found to be in afib today.  Rate 100.  Pt denies any chest pain, sob, increased heart rate or palpitations.  Discussed blood thinner.  she has a history of dementia.  Takes her on medication.  Family tries to regulate, but states their concern about her not taking her medication as directed. Concern over risk of being on blood thinner, especially if not taken correctly.   Declines.  Will start aspirin daily.  Will schedule appt with cardiology for further evaluation and question of need for echo or any further w/up or treatment.  Hold on further medication at this time.  Check thyroid function and metabolic panel.        B12 deficiency    Recheck B12 level.        Relevant Orders   Vitamin B12 (Completed)   Cognitive dysfunction    Off aricept.  Daughter desires no further medication.  With diagnosis of dementia.  Desires no further intervention.        Hypercholesterolemia    Low cholesterol diet and exercise.  Follow lipid panel.        Relevant Orders   CBC with Differential/Platelet (Completed)   Comprehensive metabolic panel (Completed)   Osteoporosis   Relevant Orders   VITAMIN D 25 Hydroxy (Vit-D Deficiency, Fractures) (Completed)   Thyroid fullness    Had thyroid ultrasound.  Thyroid nodule.  Had scheduled for biopsy.  Family called back.   Declined further w/up and continues to decline.        Relevant Orders   TSH (Completed)    Other Visit Diagnoses    Tachycardia    -  Primary   Relevant Orders   EKG 12-Lead (Completed)     I spent 40 minutes with the patient and more than 50% of the time was spent in  consultation regarding the above.  Time spent reviewing history over the last three years, discussing current concerns and discussing new diagnosis of afib and treatment options.      Einar Pheasant, MD

## 2017-01-08 ENCOUNTER — Encounter: Payer: Self-pay | Admitting: Internal Medicine

## 2017-01-08 DIAGNOSIS — I4891 Unspecified atrial fibrillation: Secondary | ICD-10-CM | POA: Insufficient documentation

## 2017-01-08 NOTE — Assessment & Plan Note (Signed)
Off aricept.  Daughter desires no further medication.  With diagnosis of dementia.  Desires no further intervention.

## 2017-01-08 NOTE — Assessment & Plan Note (Signed)
Recheck B12 level 

## 2017-01-08 NOTE — Assessment & Plan Note (Addendum)
Found to be in afib today.  Rate 100.  Pt denies any chest pain, sob, increased heart rate or palpitations.  Discussed blood thinner.  she has a history of dementia.  Takes her on medication.  Family tries to regulate, but states their concern about her not taking her medication as directed. Concern over risk of being on blood thinner, especially if not taken correctly.   Declines.  Will start aspirin daily.  Will schedule appt with cardiology for further evaluation and question of need for echo or any further w/up or treatment.  Hold on further medication at this time.  Check thyroid function and metabolic panel.

## 2017-01-08 NOTE — Assessment & Plan Note (Signed)
Had thyroid ultrasound.  Thyroid nodule.  Had scheduled for biopsy.  Family called back.  Declined further w/up and continues to decline.

## 2017-01-08 NOTE — Assessment & Plan Note (Signed)
Low cholesterol diet and exercise.  Follow lipid panel.   

## 2017-01-09 ENCOUNTER — Ambulatory Visit (INDEPENDENT_AMBULATORY_CARE_PROVIDER_SITE_OTHER): Payer: PPO

## 2017-01-09 ENCOUNTER — Ambulatory Visit: Payer: PPO

## 2017-01-09 ENCOUNTER — Encounter: Payer: Self-pay | Admitting: *Deleted

## 2017-01-09 VITALS — BP 110/62 | HR 113 | Temp 97.8°F | Resp 14 | Ht 64.0 in | Wt 112.0 lb

## 2017-01-09 DIAGNOSIS — Z0000 Encounter for general adult medical examination without abnormal findings: Secondary | ICD-10-CM | POA: Diagnosis not present

## 2017-01-09 DIAGNOSIS — E538 Deficiency of other specified B group vitamins: Secondary | ICD-10-CM

## 2017-01-09 MED ORDER — CYANOCOBALAMIN 1000 MCG/ML IJ SOLN
1000.0000 ug | Freq: Once | INTRAMUSCULAR | Status: AC
  Administered 2017-01-09: 1000 ug via INTRAMUSCULAR

## 2017-01-09 NOTE — Progress Notes (Signed)
Subjective:   Denise Garrison is a 83 y.o. female who presents for an Initial Medicare Annual Wellness Visit.  Review of Systems    No ROS.  Medicare Wellness Visit.  Cardiac Risk Factors include: advanced age (>26mn, >>51women)     Objective:    Today's Vitals   01/09/17 0817  BP: 110/62  Pulse: (!) 113  Resp: 14  Temp: 97.8 F (36.6 C)  TempSrc: Oral  SpO2: 95%  Weight: 112 lb (50.8 kg)  Height: 5' 4"  (1.626 m)   Body mass index is 19.22 kg/m.   Current Medications (verified) Outpatient Encounter Prescriptions as of 01/09/2017  Medication Sig  . cyanocobalamin (,VITAMIN B-12,) 1000 MCG/ML injection INJECT 1 ML (1,000 MCG TOTAL) INTO THE MUSCLE EVERY THIRTY DAYS  . [DISCONTINUED] fluconazole (DIFLUCAN) 150 MG tablet Take 1 tablet (150 mg total) by mouth once. (Patient not taking: Reported on 01/06/2017)  . [EXPIRED] cyanocobalamin ((VITAMIN B-12)) injection 1,000 mcg    No facility-administered encounter medications on file as of 01/09/2017.     Allergies (verified) Patient has no known allergies.   History: Past Medical History:  Diagnosis Date  . B12 deficiency   . Cancer ([facility]    history of melanoma, s/p excision x 3  . Cavernous hemangioma    History of  . Hiatal hernia    History of  . Hx of detached retina repair   . Hypercholesterolemia   . Osteoporosis    Past Surgical History:  Procedure Laterality Date  . ABDOMINAL HYSTERECTOMY  1975   Secondary to fibroid tumors  . BREAST BIOPSY     Benign  . CHOLECYSTECTOMY  4/91  . EYE SURGERY     Bilateral Cataract   . SKIN CANCER EXCISION     Malignant Melanoma x3   Family History  Problem Relation Age of Onset  . Kidney disease Father   . Heart disease Father   . Kidney disease Brother   . Parkinson's disease Brother   . Melanoma Brother   . Breast cancer Neg Hx   . Colon cancer Neg Hx    Social History   Occupational History  . Not on file.   Social History Main Topics  . Smoking  status: Never Smoker  . Smokeless tobacco: Never Used  . Alcohol use Yes     Comment: occasional wine, rare  . Drug use: No  . Sexual activity: No    Tobacco Counseling Counseling given: Not Answered   Activities of Daily Living In your present state of health, do you have any difficulty performing the following activities: 01/09/2017  Hearing? N  Vision? Y  Difficulty concentrating or making decisions? Y  Walking or climbing stairs? Y  Dressing or bathing? N  Doing errands, shopping? Y  Preparing Food and eating ? Y  Using the Toilet? N  In the past six months, have you accidently leaked urine? N  Do you have problems with loss of bowel control? N  Managing your Medications? Y  Managing your Finances? Y  Housekeeping or managing your Housekeeping? Y  Some recent data might be hidden    Immunizations and Health Maintenance There is no immunization history for the selected administration types on file for this patient. Health Maintenance Due  Topic Date Due  . TETANUS/TDAP  06/03/1944  . ZOSTAVAX  06/03/1985  . PNA vac Low Risk Adult (1 of 2 - PCV13) 06/03/1990  . MAMMOGRAM  04/04/2013    Patient Care Team: [place] Patient  Nicki Reaper, MD as PCP - General (Internal Medicine)  Indicate any recent Medical Services you may have received from other than [facility] providers in the past year (date may be approximate).     Assessment:   This is a routine wellness examination for Denise Garrison. The goal of the wellness visit is to assist the patient how to close the gaps in care and create a preventative care plan for the patient. Daughter is present and assists with information (HIPAA compliant).  Osteoporosis risk reviewed.  Medications reviewed; taking without issues or barriers.  Safety issues reviewed; smoke detectors in the home. No firearms in the home. Wears seatbelts when riding with others. No violence in the home.    No identified risk were noted; The patient was oriented x 1;  appropriate in dress and manner.  Dx of cognitive dysfunction.  Cane in use while ambulating.  Assisted by staff and family with meal prep, medication/home/financial management.  BMI; discussed the importance of a healthy diet, water intake and exercise. Educational material provided.  VIT B12 administered L deltoid, per PCP order.  Tolerated well.  Will confirm if next vaccine will be made in office or if Rx needs to be called into pharmacy.  Influenza and Prevnar 13 vaccine deferred per request; unsure if residence/facility has previously administered and will verify.  Educational material provided.  Patient Concerns: No concerns at this time.  Encouraged to follow up with PCP as needed.  Hearing/Vision screen Hearing Screening Comments: Patient passes the whisper test Vision Screening Comments: Followed by Dr. Gloriann Loan Wears glasses for reading Detached retina Macular degeneration Visual acuity not assessed per patient preference since she has regular follow up with her ophthalmologist  Dietary issues and exercise activities discussed: Current Exercise Habits: Structured exercise class (Tai-Chi, Chair exercises, Group exercises), Time (Minutes): 30, Frequency (Times/Week): 3, Weekly Exercise (Minutes/Week): 90, Intensity: Mild  Goals    . Maintain Healthy Lifestyle          Stay active and continue attending exercise classes Stay hydrated and keep drinking plenty of water, cranberry juice and fluids Eat healthy foods      Depression Screen PHQ 2/9 Scores 01/06/2017  PHQ - 2 Score 0    Fall Risk Fall Risk  01/06/2017  Falls in the past year? No    Cognitive Function: MMSE - Mini Mental State Exam 01/09/2017  Not completed: Unable to complete        Screening Tests Health Maintenance  Topic Date Due  . TETANUS/TDAP  06/03/1944  . ZOSTAVAX  06/03/1985  . PNA vac Low Risk Adult (1 of 2 - PCV13) 06/03/1990  . MAMMOGRAM  04/04/2013  . INFLUENZA VACCINE  03/11/2017  (Originally 07/12/2016)  . DEXA SCAN  Completed      Plan:    End of life planning; Advance aging; Advanced directives discussed. Copy of current HCPOA/Living Will on file.  Medicare Attestation I have personally reviewed: The patient's medical and social history Their use of alcohol, tobacco or illicit drugs Their current medications and supplements The patient's functional ability including ADLs,fall risks, home safety risks, cognitive, and hearing and visual impairment Diet and physical activities Evidence for depression   The patient's weight, height, BMI, and visual acuity have been recorded in the chart.  I have made referrals and provided education to the patient based on review of the above and I have provided the patient with a written personalized care plan for preventive services.    During the course of the visit,  Cheral was educated and counseled about the following appropriate screening and preventive services:   Vaccines to include Pneumoccal, Influenza, Hepatitis B, Td, Zostavax, HCV  Electrocardiogram  Cardiovascular disease screening  Colorectal cancer screening  Bone density screening  Diabetes screening  Glaucoma screening  Mammography/PAP  Nutrition counseling  Smoking cessation counseling  Patient Instructions (the written plan) were given to the patient.    Varney Biles, LPN   6/37/8588    Reviewed above information.  Agree with plan.  Has appt scheduled with cardiology tomorrow for evaluation of new onset afib with RVR.   Dr Nicki Reaper

## 2017-01-09 NOTE — Patient Instructions (Addendum)
  Denise Garrison , Thank you for taking time to come for your Medicare Wellness Visit. I appreciate your ongoing commitment to your health goals. Please review the following plan we discussed and let me know if I can assist you in the future.   Follow up with Dr. Nicki Reaper as needed.  VIT D3 2000 U PER DAY.  VIT B12 1070mcg EVERY WEEK FOR 4 WEEKS, THEN 1 TIME PER MONTH.  CAN SCHEDULE NURSE VISIT OR PRESCRIPTION CAN BE CALLED IN.  First injection administered today.  These are the goals we discussed: Goals    . Maintain Healthy Lifestyle          Stay active and continue attending exercise classes Stay hydrated and keep drinking plenty of water, cranberry juice and fluids Eat healthy foods       This is a list of the screening recommended for you and due dates:  Health Maintenance  Topic Date Due  . Tetanus Vaccine  06/03/1944  . Shingles Vaccine  06/03/1985  . Pneumonia vaccines (1 of 2 - PCV13) 06/03/1990  . Mammogram  04/04/2013  . Flu Shot  03/11/2017*  . DEXA scan (bone density measurement)  Completed  *Topic was postponed. The date shown is not the original due date.

## 2017-01-16 ENCOUNTER — Telehealth: Payer: Self-pay | Admitting: *Deleted

## 2017-01-16 NOTE — Telephone Encounter (Signed)
[facility]  did not [facility] the prescription for cyanocobalamin Please call [name] when sent to pharmacy [phone]

## 2017-01-17 ENCOUNTER — Other Ambulatory Visit: Payer: Self-pay | Admitting: Internal Medicine

## 2017-01-17 NOTE — Telephone Encounter (Signed)
This is why nothing was sent to pharmacy:  Notes Recorded by Leeanne Rio, CMA on 01/09/2017 at 6:58 PM EST Daughter notified & will let us know about the B12. Letter mailed to home at daughters request.  Update: I left a voicemail for daughter to inform her of directions since I'm assuming they decided to do the B12 at home. Pt to have 1,024mcg weekly x 4 weeks, then 1,092mcg monthly.

## 2017-01-19 NOTE — Telephone Encounter (Signed)
Please void, daughter returned call and stated that Rx was received

## 2017-01-19 NOTE — Telephone Encounter (Signed)
Pt's daughter stated that the pharmacy has not received Rx

## 2017-02-25 DIAGNOSIS — N390 Urinary tract infection, site not specified: Secondary | ICD-10-CM | POA: Diagnosis not present

## 2017-05-11 DIAGNOSIS — R300 Dysuria: Secondary | ICD-10-CM | POA: Diagnosis not present

## 2017-05-11 DIAGNOSIS — N390 Urinary tract infection, site not specified: Secondary | ICD-10-CM | POA: Diagnosis not present

## 2017-07-05 ENCOUNTER — Encounter: Payer: Self-pay | Admitting: Internal Medicine

## 2017-07-05 ENCOUNTER — Ambulatory Visit (INDEPENDENT_AMBULATORY_CARE_PROVIDER_SITE_OTHER): Payer: PPO | Admitting: Internal Medicine

## 2017-07-05 VITALS — BP 108/62 | HR 86 | Temp 97.9°F | Resp 12 | Ht 64.0 in | Wt 107.4 lb

## 2017-07-05 DIAGNOSIS — F09 Unspecified mental disorder due to known physiological condition: Secondary | ICD-10-CM | POA: Diagnosis not present

## 2017-07-05 DIAGNOSIS — E559 Vitamin D deficiency, unspecified: Secondary | ICD-10-CM

## 2017-07-05 DIAGNOSIS — E7800 Pure hypercholesterolemia, unspecified: Secondary | ICD-10-CM

## 2017-07-05 DIAGNOSIS — E0789 Other specified disorders of thyroid: Secondary | ICD-10-CM | POA: Diagnosis not present

## 2017-07-05 DIAGNOSIS — I4891 Unspecified atrial fibrillation: Secondary | ICD-10-CM | POA: Diagnosis not present

## 2017-07-05 DIAGNOSIS — E538 Deficiency of other specified B group vitamins: Secondary | ICD-10-CM | POA: Diagnosis not present

## 2017-07-05 DIAGNOSIS — M810 Age-related osteoporosis without current pathological fracture: Secondary | ICD-10-CM | POA: Diagnosis not present

## 2017-07-05 LAB — LIPID PANEL
CHOL/HDL RATIO: 3
Cholesterol: 189 mg/dL (ref 0–200)
HDL: 72.5 mg/dL (ref >39.00)
LDL CALC: 99 mg/dL (ref 0–99)
NONHDL: 116.37
TRIGLYCERIDES: 86 mg/dL (ref 0.0–149.0)
VLDL: 17.2 mg/dL (ref 0.0–40.0)

## 2017-07-05 LAB — HEPATIC FUNCTION PANEL
ALBUMIN: 4 g/dL (ref 3.5–5.2)
ALT: 9 U/L (ref 0–35)
AST: 10 U/L (ref 0–37)
Alkaline Phosphatase: 50 U/L (ref 39–117)
BILIRUBIN DIRECT: 0.1 mg/dL (ref 0.0–0.3)
TOTAL PROTEIN: 6.1 g/dL (ref 6.0–8.3)
Total Bilirubin: 0.6 mg/dL (ref 0.2–1.2)

## 2017-07-05 LAB — BASIC METABOLIC PANEL
BUN: 7 mg/dL (ref 6–23)
CHLORIDE: 104 meq/L (ref 96–112)
CO2: 33 meq/L — AB (ref 19–32)
Calcium: 9.4 mg/dL (ref 8.4–10.5)
Creatinine, Ser: 0.76 mg/dL (ref 0.40–1.20)
GFR: 75.64 mL/min (ref >60.00)
Glucose, Bld: 108 mg/dL — ABNORMAL HIGH (ref 70–99)
POTASSIUM: 3.8 meq/L (ref 3.5–5.1)
Sodium: 142 mEq/L (ref 135–145)

## 2017-07-05 LAB — TSH: TSH: 0.88 u[IU]/mL (ref 0.35–4.50)

## 2017-07-05 LAB — VITAMIN D 25 HYDROXY (VIT D DEFICIENCY, FRACTURES): VITD: 13.02 ng/mL — ABNORMAL LOW (ref 30.00–100.00)

## 2017-07-05 NOTE — Progress Notes (Signed)
Pre-visit discussion using our clinic review tool. No additional management support is needed unless otherwise documented below in the visit note.  

## 2017-07-05 NOTE — Progress Notes (Signed)
Patient ID: Denise Garrison, female   DOB: 05-21-1923, 83 y.o.   MRN: [id number]   Subjective:    Patient ID: Denise Garrison, female    DOB: [birth year], 84 y.o.   MRN: [id number]  HPI  Patient here for a scheduled follow up.  She is accompanied by her daughter.  History obtained from both of them.  She is currently living at [facility].  Likes living there.  Is eating.  Discussed weight loss.  Discussed adding nutritional shakes to supplement her meals.  No nausea or vomiting.  No abdominal pain.  Bowels moving.  Receiving B12 injections.  Discussed thyroid nodule.  Discussed further evaluation and w/up.  They decline.  No chest pain.  No increased heart rate or palpitations.  Never saw cardiology for afib.  Discussed today.  Desires no further w/up or evaluation.  Discussed blood thinner.  See previous note.  Will start aspirin daily.     Past Medical History:  Diagnosis Date  . B12 deficiency   . Cancer [facility])    history of melanoma, s/p excision x 3  . Cavernous hemangioma    History of  . Hiatal hernia    History of  . Hx of detached retina repair   . Hypercholesterolemia   . Osteoporosis    Past Surgical History:  Procedure Laterality Date  . ABDOMINAL HYSTERECTOMY  1975   Secondary to fibroid tumors  . BREAST BIOPSY     Benign  . CHOLECYSTECTOMY  4/91  . EYE SURGERY     Bilateral Cataract   . SKIN CANCER EXCISION     Malignant Melanoma x3   Family History  Problem Relation Age of Onset  . Kidney disease Father   . Heart disease Father   . Kidney disease Brother   . Parkinson's disease Brother   . Melanoma Brother   . Breast cancer Neg Hx   . Colon cancer Neg Hx    Social History   Social History  . Marital status: Widowed    Spouse name: N/A  . Number of children: 2  . Years of education: N/A   Social History Main Topics  . Smoking status: Never Smoker  . Smokeless tobacco: Never Used  . Alcohol use Yes     Comment: occasional wine, rare  . Drug use:  No  . Sexual activity: No   Other Topics Concern  . None   Social History Narrative   She is a widow ,has two children one son and one daughter.  She resided at [facility] Encounter Prescriptions as of 07/05/2017  Medication Sig  . B-D 3CC LUER-LOK SYR 25GX5/8" 25G X 5/8" 3 ML MISC USE AS DIRECTED WITH B-12 INJECTIONS  . cyanocobalamin (,VITAMIN B-12,) 1000 MCG/ML injection Inject 1ML (1,067mcg) into muscle once a week x 4 weeks, then once a month.   No facility-administered encounter medications on file as of 07/05/2017.     Review of Systems  Constitutional: Negative for appetite change and unexpected weight change.  HENT: Negative for congestion and sinus pressure.   Respiratory: Negative for cough, chest tightness and shortness of breath.   Cardiovascular: Negative for chest pain, palpitations and leg swelling.  Gastrointestinal: Negative for abdominal pain, diarrhea, nausea and vomiting.  Genitourinary: Negative for difficulty urinating and dysuria.  Musculoskeletal: Negative for back pain and joint swelling.  Skin: Negative for color change and rash.  Neurological: Negative for dizziness, light-headedness and headaches.  Psychiatric/Behavioral: Negative for  agitation and dysphoric mood.       Objective:    Physical Exam  Constitutional: She appears well-developed and well-nourished. No distress.  HENT:  Nose: Nose normal.  Mouth/Throat: Oropharynx is clear and moist.  Neck: Neck supple. No thyromegaly present.  Cardiovascular: Normal rate.   Irregularly irregular.  Rated controlled.   Pulmonary/Chest: Breath sounds normal. No respiratory distress. She has no wheezes.  Abdominal: Soft. Bowel sounds are normal. There is no tenderness.  Musculoskeletal: She exhibits no edema or tenderness.  Lymphadenopathy:    She has no cervical adenopathy.  Skin: No rash noted. No erythema.  Psychiatric: She has a normal mood and affect. Her behavior is normal.    BP  108/62 (BP Location: Left Arm, Patient Position: Sitting, Cuff Size: Normal)   Pulse 86   Temp 97.9 F (36.6 C) (Oral)   Resp 12   Ht 5\' 4"  (1.626 m)   Wt 107 lb 6.4 oz (48.7 kg)   BMI 18.44 kg/m  Wt Readings from Last 3 Encounters:  07/05/17 107 lb 6.4 oz (48.7 kg)  01/09/17 112 lb (50.8 kg)  01/06/17 112 lb 4 oz (50.9 kg)     Lab Results  Component Value Date   WBC 7.0 01/06/2017   HGB 14.4 01/06/2017   HCT 44.2 01/06/2017   PLT 271.0 01/06/2017   GLUCOSE 108 (H) 07/05/2017   CHOL 189 07/05/2017   TRIG 86.0 07/05/2017   HDL 72.50 07/05/2017   LDLCALC 99 07/05/2017   ALT 9 07/05/2017   AST 10 07/05/2017   NA 142 07/05/2017   K 3.8 07/05/2017   CL 104 07/05/2017   CREATININE 0.76 07/05/2017   BUN 7 07/05/2017   CO2 33 (H) 07/05/2017   TSH 0.88 07/05/2017   HGBA1C 6.0 (H) 03/07/2014       Assessment & Plan:   Problem List Items Addressed This Visit    A-fib ([place])    Remains in afib.  Ventricular rate in the 90s.  No chest pain.  No sob.  Does not notice increased heart rate or palpitations.  Declines further evaluation or w/up.  Declines further prescription medication. Did agree to take aspirin daily.        Relevant Orders   Basic metabolic panel (Completed)   B12 deficiency    Continue B12 injections.        Cognitive dysfunction    Off aricept.  Daughter desires no further medication.  Has been diagnosed with dementia.  Desires no further intervention.        Hypercholesterolemia    Follow lipid panel.       Relevant Orders   Lipid panel (Completed)   Hepatic function panel (Completed)   Osteoporosis    Desires no further evaluation or medication.  Weight bearing exercise.  Check vitamin D level.       Thyroid fullness - Primary    Had thyroid ultrasound.  Thyroid nodule.  Had scheduled for biopsy.  Family cancelled.  Discussed today.  They continue to decline further w/up and evaluation.        Relevant Orders   TSH (Completed)   Vitamin D  deficiency    Not taking vitamin d supplements.  Will recheck level.       Relevant Orders   VITAMIN D 25 Hydroxy (Vit-D Deficiency, Fractures) (Completed)       Einar Pheasant, MD

## 2017-07-08 ENCOUNTER — Encounter: Payer: Self-pay | Admitting: Internal Medicine

## 2017-07-08 NOTE — Assessment & Plan Note (Signed)
Off aricept.  Daughter desires no further medication.  Has been diagnosed with dementia.  Desires no further intervention.

## 2017-07-08 NOTE — Assessment & Plan Note (Signed)
Desires no further evaluation or medication.  Weight bearing exercise.  Check vitamin D level.

## 2017-07-08 NOTE — Assessment & Plan Note (Signed)
Had thyroid ultrasound.  Thyroid nodule.  Had scheduled for biopsy.  Family cancelled.  Discussed today.  They continue to decline further w/up and evaluation.

## 2017-07-08 NOTE — Assessment & Plan Note (Signed)
Not taking vitamin d supplements.  Will recheck level.

## 2017-07-08 NOTE — Assessment & Plan Note (Signed)
Continue B12 injections.   

## 2017-07-08 NOTE — Assessment & Plan Note (Signed)
Remains in afib.  Ventricular rate in the 90s.  No chest pain.  No sob.  Does not notice increased heart rate or palpitations.  Declines further evaluation or w/up.  Declines further prescription medication. Did agree to take aspirin daily.

## 2017-07-08 NOTE — Assessment & Plan Note (Signed)
Follow lipid panel.   

## 2017-08-12 DIAGNOSIS — J019 Acute sinusitis, unspecified: Secondary | ICD-10-CM | POA: Diagnosis not present

## 2017-08-12 DIAGNOSIS — N390 Urinary tract infection, site not specified: Secondary | ICD-10-CM | POA: Diagnosis not present

## 2017-08-12 DIAGNOSIS — A499 Bacterial infection, unspecified: Secondary | ICD-10-CM | POA: Diagnosis not present

## 2017-08-12 DIAGNOSIS — B9689 Other specified bacterial agents as the cause of diseases classified elsewhere: Secondary | ICD-10-CM | POA: Diagnosis not present

## 2017-08-12 DIAGNOSIS — R300 Dysuria: Secondary | ICD-10-CM | POA: Diagnosis not present

## 2017-10-06 ENCOUNTER — Ambulatory Visit: Payer: PPO | Admitting: Internal Medicine

## 2018-01-10 ENCOUNTER — Ambulatory Visit: Payer: PPO | Admitting: Internal Medicine

## 2018-01-10 ENCOUNTER — Ambulatory Visit: Payer: PPO

## 2018-01-24 ENCOUNTER — Telehealth: Payer: Self-pay | Admitting: Internal Medicine

## 2018-01-24 ENCOUNTER — Other Ambulatory Visit: Payer: Self-pay

## 2018-01-24 MED ORDER — CYANOCOBALAMIN 1000 MCG/ML IJ SOLN: INTRAMUSCULAR | 5 refills | Status: DC

## 2018-01-24 MED ORDER — "SYRINGE/NEEDLE (DISP) 25G X 5/8"" 3 ML MISC": 5 refills | Status: DC

## 2018-01-24 NOTE — Telephone Encounter (Signed)
Copied from [place] [id number]. Topic: Quick Communication - Rx Refill/Question >> Jan 24, 2018  9:20 AM Margot Ables wrote: Medication: b12 - pt will need refill - appt had to be cancelled from 01/10/18 and next available is 04/17/18 - I have scheduled pt and on wait list - please advise Has the patient contacted their pharmacy? No. Preferred Pharmacy (with phone number or street name): [place], [place] - [place] [phone] (Phone) [phone] (Fax)  Agent: Please be advised that RX refills may take up to 3 business days. We ask that you follow-up with your pharmacy.

## 2018-01-24 NOTE — Telephone Encounter (Signed)
Patients daughter aware

## 2018-01-30 DIAGNOSIS — R410 Disorientation, unspecified: Secondary | ICD-10-CM | POA: Diagnosis not present

## 2018-01-30 DIAGNOSIS — N390 Urinary tract infection, site not specified: Secondary | ICD-10-CM | POA: Diagnosis not present

## 2018-04-17 ENCOUNTER — Encounter

## 2018-04-17 ENCOUNTER — Ambulatory Visit: Payer: PPO | Admitting: Internal Medicine

## 2018-04-18 ENCOUNTER — Ambulatory Visit (INDEPENDENT_AMBULATORY_CARE_PROVIDER_SITE_OTHER): Payer: PPO | Admitting: Internal Medicine

## 2018-04-18 ENCOUNTER — Encounter: Payer: Self-pay | Admitting: Internal Medicine

## 2018-04-18 VITALS — BP 110/60 | HR 101 | Temp 97.8°F | Resp 18 | Ht 64.0 in | Wt 104.2 lb

## 2018-04-18 DIAGNOSIS — E7800 Pure hypercholesterolemia, unspecified: Secondary | ICD-10-CM | POA: Diagnosis not present

## 2018-04-18 DIAGNOSIS — E538 Deficiency of other specified B group vitamins: Secondary | ICD-10-CM

## 2018-04-18 DIAGNOSIS — E0789 Other specified disorders of thyroid: Secondary | ICD-10-CM

## 2018-04-18 DIAGNOSIS — M810 Age-related osteoporosis without current pathological fracture: Secondary | ICD-10-CM | POA: Diagnosis not present

## 2018-04-18 DIAGNOSIS — I4891 Unspecified atrial fibrillation: Secondary | ICD-10-CM | POA: Diagnosis not present

## 2018-04-18 DIAGNOSIS — E559 Vitamin D deficiency, unspecified: Secondary | ICD-10-CM | POA: Diagnosis not present

## 2018-04-18 DIAGNOSIS — F09 Unspecified mental disorder due to known physiological condition: Secondary | ICD-10-CM

## 2018-04-18 NOTE — Progress Notes (Signed)
Patient ID: Denise Garrison, female   DOB: 07/28/1923, 84 y.o.   MRN: [id number]   Subjective:    Patient ID: Denise Garrison, female    DOB: [birth year], 84 y.o.   MRN: [id number]  HPI  Patient here for a scheduled follow up.  She is accompanied by her daughter.  History obtained from both of them.  She reports she is eating.  Residing at [facility].  Daughter states she stays out of her room a lot and interacts with other residents.  Walks.  No chest pain.  No sob.  No acid reflux.  No abdominal pain.  Bowels moving.  No urine change.  Weight decreased a few more pounds.  Discussed supplementing meals with nutritional shakes.      Past Medical History:  Diagnosis Date  . B12 deficiency   . Cancer [facility])    history of melanoma, s/p excision x 3  . Cavernous hemangioma    History of  . Hiatal hernia    History of  . Hx of detached retina repair   . Hypercholesterolemia   . Osteoporosis    Past Surgical History:  Procedure Laterality Date  . ABDOMINAL HYSTERECTOMY  1975   Secondary to fibroid tumors  . BREAST BIOPSY     Benign  . CHOLECYSTECTOMY  4/91  . EYE SURGERY     Bilateral Cataract   . SKIN CANCER EXCISION     Malignant Melanoma x3   Family History  Problem Relation Age of Onset  . Kidney disease Father   . Heart disease Father   . Kidney disease Brother   . Parkinson's disease Brother   . Melanoma Brother   . Breast cancer Neg Hx   . Colon cancer Neg Hx    Social History   Socioeconomic History  . Marital status: Widowed    Spouse name: Not on file  . Number of children: 2  . Years of education: Not on file  . Highest education level: Not on file  Occupational History  . Not on file  Social Needs  . Financial resource strain: Not on file  . Food insecurity:    Worry: Not on file    Inability: Not on file  . Transportation needs:    Medical: Not on file    Non-medical: Not on file  Tobacco Use  . Smoking status: Never Smoker  . Smokeless tobacco:  Never Used  Substance and Sexual Activity  . Alcohol use: Yes    Comment: occasional wine, rare  . Drug use: No  . Sexual activity: Never  Lifestyle  . Physical activity:    Days per week: Not on file    Minutes per session: Not on file  . Stress: Not on file  Relationships  . Social connections:    Talks on phone: Not on file    Gets together: Not on file    Attends religious service: Not on file    Active member of club or organization: Not on file    Attends meetings of clubs or organizations: Not on file    Relationship status: Not on file  Other Topics Concern  . Not on file  Social History Narrative   She is a widow ,has two children one son and one daughter.  She resided at [facility]    Outpatient Encounter Medications as of 04/18/2018  Medication Sig  . cyanocobalamin (,VITAMIN B-12,) 1000 MCG/ML injection Inject 1ML (1,033mcg) into muscle once a week x  4 weeks, then once a month.  . SYRINGE-NEEDLE, DISP, 3 ML (B-D 3CC LUER-LOK SYR 25GX5/8") 25G X 5/8" 3 ML MISC USE AS DIRECTED WITH B-12 INJECTIONS   No facility-administered encounter medications on file as of 04/18/2018.     Review of Systems  Constitutional:       States she is eating.  Decreased weight.    HENT: Negative for congestion and sinus pressure.   Respiratory: Negative for cough, chest tightness and shortness of breath.   Cardiovascular: Negative for chest pain, palpitations and leg swelling.  Gastrointestinal: Negative for abdominal pain, diarrhea, nausea and vomiting.  Genitourinary: Negative for difficulty urinating and dysuria.  Musculoskeletal: Negative for joint swelling and myalgias.  Skin: Negative for color change and rash.  Neurological: Negative for dizziness, light-headedness and headaches.  Psychiatric/Behavioral: Negative for agitation and dysphoric mood.       Objective:    Physical Exam  Constitutional: She appears well-developed and well-nourished. No distress.  HENT:  Nose: Nose  normal.  Mouth/Throat: Oropharynx is clear and moist.  Neck: Neck supple. No thyromegaly present.  Cardiovascular: Normal rate and regular rhythm.  Pulmonary/Chest: Breath sounds normal. No respiratory distress. She has no wheezes.  Abdominal: Soft. Bowel sounds are normal. There is no tenderness.  Musculoskeletal: She exhibits no edema or tenderness.  Lymphadenopathy:    She has no cervical adenopathy.  Skin: No rash noted. No erythema.  Psychiatric: She has a normal mood and affect. Her behavior is normal.    BP 110/60   Pulse (!) 101   Temp 97.8 F (36.6 C) (Oral)   Resp 18   Ht 5\' 4"  (1.626 m)   Wt 104 lb 4 oz (47.3 kg)   SpO2 98%   BMI 17.89 kg/m  Wt Readings from Last 3 Encounters:  04/18/18 104 lb 4 oz (47.3 kg)  07/05/17 107 lb 6.4 oz (48.7 kg)  01/09/17 112 lb (50.8 kg)     Lab Results  Component Value Date   WBC 6.1 04/18/2018   HGB 13.9 04/18/2018   HCT 42.7 04/18/2018   PLT 255.0 04/18/2018   GLUCOSE 93 04/18/2018   CHOL 175 04/18/2018   TRIG 106.0 04/18/2018   HDL 75.40 04/18/2018   LDLCALC 78 04/18/2018   ALT 8 04/18/2018   AST 12 04/18/2018   NA 138 04/18/2018   K 3.9 04/18/2018   CL 101 04/18/2018   CREATININE 0.62 04/18/2018   BUN 8 04/18/2018   CO2 30 04/18/2018   TSH 1.17 04/18/2018   HGBA1C 6.0 (H) 03/07/2014       Assessment & Plan:   Problem List Items Addressed This Visit    A-fib ([place])    Remains in afib.  Repeat heart rate on my check - 96.  Discussed further w/up, treatment and anticoagulation.  Declines.  Desires no further medication or treatment.        B12 deficiency - Primary    Check B12.        Relevant Orders   CBC with Differential/Platelet (Completed)   Vitamin B12 (Completed)   Cognitive dysfunction    Off aricept.  Daughter declines any further medication.  Follow.        Hypercholesterolemia    Follow lipid panel.       Relevant Orders   Hepatic function panel (Completed)   Lipid panel (Completed)    Basic metabolic panel (Completed)   Osteoporosis    Desires no further evaluation or medication.  Follow.  Thyroid fullness    Had thyroid ultrasound.  Thyroid nodule.  Had scheduled her for a biopsy.  Family cancelled.  Discussed again today.  Continue to decline.        Relevant Orders   TSH (Completed)   Vitamin D deficiency    Recheck vitamin D level.       Relevant Orders   VITAMIN D 25 Hydroxy (Vit-D Deficiency, Fractures) (Completed)       Einar Pheasant, MD

## 2018-04-19 LAB — HEPATIC FUNCTION PANEL
ALK PHOS: 57 U/L (ref 39–117)
ALT: 8 U/L (ref 0–35)
AST: 12 U/L (ref 0–37)
Albumin: 4 g/dL (ref 3.5–5.2)
BILIRUBIN DIRECT: 0.1 mg/dL (ref 0.0–0.3)
BILIRUBIN TOTAL: 0.3 mg/dL (ref 0.2–1.2)
Total Protein: 6.2 g/dL (ref 6.0–8.3)

## 2018-04-19 LAB — BASIC METABOLIC PANEL
BUN: 8 mg/dL (ref 6–23)
CO2: 30 meq/L (ref 19–32)
Calcium: 8.9 mg/dL (ref 8.4–10.5)
Chloride: 101 mEq/L (ref 96–112)
Creatinine, Ser: 0.62 mg/dL (ref 0.40–1.20)
GFR: 95.5 mL/min (ref >60.00)
GLUCOSE: 93 mg/dL (ref 70–99)
Potassium: 3.9 mEq/L (ref 3.5–5.1)
Sodium: 138 mEq/L (ref 135–145)

## 2018-04-19 LAB — CBC WITH DIFFERENTIAL/PLATELET
BASOS ABS: 0 10*3/uL (ref 0.0–0.1)
BASOS PCT: 0.8 % (ref 0.0–3.0)
EOS ABS: 0.1 10*3/uL (ref 0.0–0.7)
Eosinophils Relative: 1.2 % (ref 0.0–5.0)
HEMATOCRIT: 42.7 % (ref 36.0–46.0)
Hemoglobin: 13.9 g/dL (ref 12.0–15.0)
LYMPHS ABS: 1.8 10*3/uL (ref 0.7–4.0)
LYMPHS PCT: 30.2 % (ref 12.0–46.0)
MCHC: 32.6 g/dL (ref 30.0–36.0)
MCV: 88 fl (ref 78.0–100.0)
MONO ABS: 0.7 10*3/uL (ref 0.1–1.0)
MONOS PCT: 11.4 % (ref 3.0–12.0)
NEUTROS PCT: 56.4 % (ref 43.0–77.0)
Neutro Abs: 3.4 10*3/uL (ref 1.4–7.7)
PLATELETS: 255 10*3/uL (ref 150.0–400.0)
RBC: 4.86 Mil/uL (ref 3.87–5.11)
RDW: 14.6 % (ref 11.5–15.5)
WBC: 6.1 10*3/uL (ref 4.0–10.5)

## 2018-04-19 LAB — VITAMIN D 25 HYDROXY (VIT D DEFICIENCY, FRACTURES): VITD: 10.7 ng/mL — ABNORMAL LOW (ref 30.00–100.00)

## 2018-04-19 LAB — TSH: TSH: 1.17 u[IU]/mL (ref 0.35–4.50)

## 2018-04-19 LAB — LIPID PANEL
CHOL/HDL RATIO: 2
Cholesterol: 175 mg/dL (ref 0–200)
HDL: 75.4 mg/dL (ref >39.00)
LDL CALC: 78 mg/dL (ref 0–99)
NONHDL: 99.26
TRIGLYCERIDES: 106 mg/dL (ref 0.0–149.0)
VLDL: 21.2 mg/dL (ref 0.0–40.0)

## 2018-04-19 LAB — VITAMIN B12: Vitamin B-12: 380 pg/mL (ref 211–911)

## 2018-04-20 ENCOUNTER — Other Ambulatory Visit: Payer: Self-pay | Admitting: *Deleted

## 2018-04-20 MED ORDER — VITAMIN D (ERGOCALCIFEROL) 1.25 MG (50000 UNIT) PO CAPS: 50000.0000 [IU] | ORAL_CAPSULE | ORAL | 2 refills | Status: DC

## 2018-04-21 ENCOUNTER — Encounter: Payer: Self-pay | Admitting: Internal Medicine

## 2018-04-21 NOTE — Assessment & Plan Note (Signed)
Desires no further evaluation or medication.  Follow.

## 2018-04-21 NOTE — Assessment & Plan Note (Signed)
Check B12 

## 2018-04-21 NOTE — Assessment & Plan Note (Signed)
Remains in afib.  Repeat heart rate on my check - 96.  Discussed further w/up, treatment and anticoagulation.  Declines.  Desires no further medication or treatment.

## 2018-04-21 NOTE — Assessment & Plan Note (Signed)
Off aricept.  Daughter declines any further medication.  Follow.

## 2018-04-21 NOTE — Assessment & Plan Note (Signed)
Follow lipid panel.   

## 2018-04-21 NOTE — Assessment & Plan Note (Signed)
Recheck vitamin D level 

## 2018-04-21 NOTE — Assessment & Plan Note (Signed)
Had thyroid ultrasound.  Thyroid nodule.  Had scheduled her for a biopsy.  Family cancelled.  Discussed again today.  Continue to decline.

## 2018-05-25 DIAGNOSIS — R399 Unspecified symptoms and signs involving the genitourinary system: Secondary | ICD-10-CM | POA: Diagnosis not present

## 2018-05-25 DIAGNOSIS — R410 Disorientation, unspecified: Secondary | ICD-10-CM | POA: Diagnosis not present

## 2018-05-25 DIAGNOSIS — A499 Bacterial infection, unspecified: Secondary | ICD-10-CM | POA: Diagnosis not present

## 2018-05-25 DIAGNOSIS — N390 Urinary tract infection, site not specified: Secondary | ICD-10-CM | POA: Diagnosis not present

## 2018-08-20 ENCOUNTER — Ambulatory Visit (INDEPENDENT_AMBULATORY_CARE_PROVIDER_SITE_OTHER): Payer: PPO | Admitting: Internal Medicine

## 2018-08-20 ENCOUNTER — Encounter: Payer: Self-pay | Admitting: Internal Medicine

## 2018-08-20 ENCOUNTER — Telehealth: Payer: Self-pay

## 2018-08-20 VITALS — BP 104/62 | HR 88 | Temp 97.9°F | Resp 18 | Wt 106.2 lb

## 2018-08-20 DIAGNOSIS — E0789 Other specified disorders of thyroid: Secondary | ICD-10-CM

## 2018-08-20 DIAGNOSIS — I4891 Unspecified atrial fibrillation: Secondary | ICD-10-CM | POA: Diagnosis not present

## 2018-08-20 DIAGNOSIS — E559 Vitamin D deficiency, unspecified: Secondary | ICD-10-CM | POA: Diagnosis not present

## 2018-08-20 DIAGNOSIS — E7800 Pure hypercholesterolemia, unspecified: Secondary | ICD-10-CM | POA: Diagnosis not present

## 2018-08-20 DIAGNOSIS — Z23 Encounter for immunization: Secondary | ICD-10-CM | POA: Diagnosis not present

## 2018-08-20 DIAGNOSIS — M810 Age-related osteoporosis without current pathological fracture: Secondary | ICD-10-CM

## 2018-08-20 NOTE — Patient Instructions (Signed)
Can try a probiotic daily.  Can add culturelle or florastor to what you drink in the am.  Let me know if any problems.     Also, needs to take vitamin D3 1000 units per day.  (should be off prescription vitamin D now).

## 2018-08-20 NOTE — Progress Notes (Signed)
Patient ID: Denise Garrison, female   DOB: 1923-08-25, 84 y.o.   MRN: [id number]   Subjective:    Patient ID: Denise Garrison, female    DOB: 03/02/1923, 84 y.o.   MRN: [id number]  HPI  Patient here for a scheduled follow up.  She is accompanied by her daughter.  History obtained from both of them.  Reports she is doing well.  Eating.  Weight is stable.  No chest pain.  No sob.  No acid reflux.  No abdominal pain.  Bowels moving.  Overall daughter feels she is doing better.  Enjoying where she is living.     Past Medical History:  Diagnosis Date  . B12 deficiency   . Cancer [facility])    history of melanoma, s/p excision x 3  . Cavernous hemangioma    History of  . Hiatal hernia    History of  . Hx of detached retina repair   . Hypercholesterolemia   . Osteoporosis    Past Surgical History:  Procedure Laterality Date  . ABDOMINAL HYSTERECTOMY  1975   Secondary to fibroid tumors  . BREAST BIOPSY     Benign  . CHOLECYSTECTOMY  4/91  . EYE SURGERY     Bilateral Cataract   . SKIN CANCER EXCISION     Malignant Melanoma x3   Family History  Problem Relation Age of Onset  . Kidney disease Father   . Heart disease Father   . Kidney disease Brother   . Parkinson's disease Brother   . Melanoma Brother   . Breast cancer Neg Hx   . Colon cancer Neg Hx    Social History   Socioeconomic History  . Marital status: Widowed    Spouse name: Not on file  . Number of children: 2  . Years of education: Not on file  . Highest education level: Not on file  Occupational History  . Not on file  Social Needs  . Financial resource strain: Not on file  . Food insecurity:    Worry: Not on file    Inability: Not on file  . Transportation needs:    Medical: Not on file    Non-medical: Not on file  Tobacco Use  . Smoking status: Never Smoker  . Smokeless tobacco: Never Used  Substance and Sexual Activity  . Alcohol use: Yes    Comment: occasional wine, rare  . Drug use: No  . Sexual  activity: Never  Lifestyle  . Physical activity:    Days per week: Not on file    Minutes per session: Not on file  . Stress: Not on file  Relationships  . Social connections:    Talks on phone: Not on file    Gets together: Not on file    Attends religious service: Not on file    Active member of club or organization: Not on file    Attends meetings of clubs or organizations: Not on file    Relationship status: Not on file  Other Topics Concern  . Not on file  Social History Narrative   She is a widow ,has two children one son and one daughter.  She resided at [facility]    Outpatient Encounter Medications as of 08/20/2018  Medication Sig  . cyanocobalamin (,VITAMIN B-12,) 1000 MCG/ML injection Inject 1ML (1,010mcg) into muscle once a week x 4 weeks, then once a month.  . SYRINGE-NEEDLE, DISP, 3 ML (B-D 3CC LUER-LOK SYR 25GX5/8") 25G X 5/8" 3 ML  MISC USE AS DIRECTED WITH B-12 INJECTIONS  . Vitamin D, Ergocalciferol, (DRISDOL) 50000 units CAPS capsule Take 1 capsule (50,000 Units total) by mouth every 7 (seven) days.   No facility-administered encounter medications on file as of 08/20/2018.     Review of Systems  Constitutional: Negative for appetite change and unexpected weight change.  HENT: Negative for congestion and sinus pressure.   Respiratory: Negative for cough, chest tightness and shortness of breath.   Cardiovascular: Negative for chest pain, palpitations and leg swelling.  Gastrointestinal: Negative for abdominal pain, diarrhea, nausea and vomiting.  Genitourinary: Negative for difficulty urinating and dysuria.  Musculoskeletal: Negative for joint swelling and myalgias.  Skin: Negative for color change and rash.  Neurological: Negative for dizziness, light-headedness and headaches.  Psychiatric/Behavioral: Negative for agitation and dysphoric mood.       Objective:     Blood pressure rechecked by me:  118/62  Physical Exam  Constitutional: She appears  well-developed and well-nourished. No distress.  HENT:  Nose: Nose normal.  Mouth/Throat: Oropharynx is clear and moist.  Neck: Neck supple. No thyromegaly present.  Cardiovascular: Normal rate.  Rate controlled.    Pulmonary/Chest: Breath sounds normal. No respiratory distress. She has no wheezes.  Abdominal: Soft. Bowel sounds are normal. There is no tenderness.  Musculoskeletal: She exhibits no edema or tenderness.  Lymphadenopathy:    She has no cervical adenopathy.  Skin: No rash noted. No erythema.  Psychiatric: She has a normal mood and affect. Her behavior is normal.    BP 104/62 (BP Location: Left Arm, Patient Position: Sitting, Cuff Size: Normal)   Pulse 88   Temp 97.9 F (36.6 C) (Oral)   Resp 18   Wt 106 lb 3.2 oz (48.2 kg)   SpO2 96%   BMI 18.23 kg/m  Wt Readings from Last 3 Encounters:  08/20/18 106 lb 3.2 oz (48.2 kg)  04/18/18 104 lb 4 oz (47.3 kg)  07/05/17 107 lb 6.4 oz (48.7 kg)     Lab Results  Component Value Date   WBC 6.1 04/18/2018   HGB 13.9 04/18/2018   HCT 42.7 04/18/2018   PLT 255.0 04/18/2018   GLUCOSE 93 04/18/2018   CHOL 175 04/18/2018   TRIG 106.0 04/18/2018   HDL 75.40 04/18/2018   LDLCALC 78 04/18/2018   ALT 8 04/18/2018   AST 12 04/18/2018   NA 138 04/18/2018   K 3.9 04/18/2018   CL 101 04/18/2018   CREATININE 0.62 04/18/2018   BUN 8 04/18/2018   CO2 30 04/18/2018   TSH 1.17 04/18/2018   HGBA1C 6.0 (H) 03/07/2014       Assessment & Plan:   Problem List Items Addressed This Visit    A-fib ([place])    Remains in afib.  Again discussed further w/up and treatment.  Declines.  Follow.        Hypercholesterolemia    Follow lipid panel.        Osteoporosis    Desires no further evaluation and treatment.        Thyroid fullness    Had thyroid ultrasound.  Thyroid nodule.  Had scheduled her for a biopsy.  Family cancelled.  Discussed again with them today.  Discussed concern regarding abnormal ultrasound.  Decline further  evaluation and w/up.        Vitamin D deficiency    Vitamin D supplments.         Other Visit Diagnoses    Need for influenza vaccination    -  Primary   Relevant Orders   Flu vaccine HIGH DOSE PF (Fluzone High dose) (Completed)       Einar Pheasant, MD

## 2018-08-20 NOTE — Telephone Encounter (Signed)
Will collect urine on pt when she gets here.

## 2018-08-20 NOTE — Telephone Encounter (Signed)
Copied from [place] [id number]. Topic: General - Other >> Aug 20, 2018  2:34 PM Denise Garrison wrote: Reason for CRM: Pt is coming in today at 3:30 for a follow up visit and would like to know if she could also do a urine test for a suspected UTI. Please inform.

## 2018-08-20 NOTE — Telephone Encounter (Signed)
Noted  

## 2018-08-25 ENCOUNTER — Encounter: Payer: Self-pay | Admitting: Internal Medicine

## 2018-08-25 NOTE — Assessment & Plan Note (Signed)
Desires no further evaluation and treatment.   

## 2018-08-25 NOTE — Assessment & Plan Note (Signed)
Had thyroid ultrasound.  Thyroid nodule.  Had scheduled her for a biopsy.  Family cancelled.  Discussed again with them today.  Discussed concern regarding abnormal ultrasound.  Decline further evaluation and w/up.

## 2018-08-25 NOTE — Assessment & Plan Note (Signed)
Follow lipid panel.   

## 2018-08-25 NOTE — Assessment & Plan Note (Signed)
Remains in afib.  Again discussed further w/up and treatment.  Declines.  Follow.

## 2018-08-25 NOTE — Assessment & Plan Note (Signed)
Vitamin D supplments.

## 2018-12-20 ENCOUNTER — Ambulatory Visit (INDEPENDENT_AMBULATORY_CARE_PROVIDER_SITE_OTHER): Payer: PPO | Admitting: Internal Medicine

## 2018-12-20 ENCOUNTER — Encounter: Payer: Self-pay | Admitting: Internal Medicine

## 2018-12-20 VITALS — BP 116/62 | HR 82 | Temp 97.8°F | Resp 16 | Wt 107.8 lb

## 2018-12-20 DIAGNOSIS — I4891 Unspecified atrial fibrillation: Secondary | ICD-10-CM | POA: Diagnosis not present

## 2018-12-20 DIAGNOSIS — E0789 Other specified disorders of thyroid: Secondary | ICD-10-CM | POA: Diagnosis not present

## 2018-12-20 DIAGNOSIS — E7800 Pure hypercholesterolemia, unspecified: Secondary | ICD-10-CM | POA: Diagnosis not present

## 2018-12-20 DIAGNOSIS — E559 Vitamin D deficiency, unspecified: Secondary | ICD-10-CM

## 2018-12-20 DIAGNOSIS — F09 Unspecified mental disorder due to known physiological condition: Secondary | ICD-10-CM | POA: Diagnosis not present

## 2018-12-20 DIAGNOSIS — Z23 Encounter for immunization: Secondary | ICD-10-CM

## 2018-12-20 DIAGNOSIS — M810 Age-related osteoporosis without current pathological fracture: Secondary | ICD-10-CM

## 2018-12-20 NOTE — Progress Notes (Signed)
Patient ID: Denise Garrison, female   DOB: 1923-03-07, 85 y.o.   MRN: [id number]   Subjective:    Patient ID: Denise Garrison, female    DOB: [birth year], 85 y.o.   MRN: [id number]  HPI  Patient here for a scheduled follow up.  She is accompanied by her daughter.  History obtained from both of them.  Reports she is doing well.  Feels good. States she is eating.  Enjoying where she is staying.  No chest pain.  No sob.  No increased heart rate or palpitations.  No acid reflux.  No abdominal pain.  Bowels moving.  No urine change.     Past Medical History:  Diagnosis Date  . B12 deficiency   . Cancer [facility])    history of melanoma, s/p excision x 3  . Cavernous hemangioma    History of  . Hiatal hernia    History of  . Hx of detached retina repair   . Hypercholesterolemia   . Osteoporosis    Past Surgical History:  Procedure Laterality Date  . ABDOMINAL HYSTERECTOMY  1975   Secondary to fibroid tumors  . BREAST BIOPSY     Benign  . CHOLECYSTECTOMY  4/91  . EYE SURGERY     Bilateral Cataract   . SKIN CANCER EXCISION     Malignant Melanoma x3   Family History  Problem Relation Age of Onset  . Kidney disease Father   . Heart disease Father   . Kidney disease Brother   . Parkinson's disease Brother   . Melanoma Brother   . Breast cancer Neg Hx   . Colon cancer Neg Hx    Social History   Socioeconomic History  . Marital status: Widowed    Spouse name: Not on file  . Number of children: 2  . Years of education: Not on file  . Highest education level: Not on file  Occupational History  . Not on file  Social Needs  . Financial resource strain: Not on file  . Food insecurity:    Worry: Not on file    Inability: Not on file  . Transportation needs:    Medical: Not on file    Non-medical: Not on file  Tobacco Use  . Smoking status: Never Smoker  . Smokeless tobacco: Never Used  Substance and Sexual Activity  . Alcohol use: Yes    Comment: occasional wine, rare  .  Drug use: No  . Sexual activity: Never  Lifestyle  . Physical activity:    Days per week: Not on file    Minutes per session: Not on file  . Stress: Not on file  Relationships  . Social connections:    Talks on phone: Not on file    Gets together: Not on file    Attends religious service: Not on file    Active member of club or organization: Not on file    Attends meetings of clubs or organizations: Not on file    Relationship status: Not on file  Other Topics Concern  . Not on file  Social History Narrative   She is a widow ,has two children one son and one daughter.  [facility] resided at [facility]    Outpatient Encounter Medications as of 12/20/2018  Medication Sig  . [DISCONTINUED] cyanocobalamin (,VITAMIN B-12,) 1000 MCG/ML injection Inject 1ML (1,045mcg) into muscle once a week x 4 weeks, then once a month.  . [DISCONTINUED] SYRINGE-NEEDLE, DISP, 3 ML (B-D 3CC LUER-LOK SYR  25GX5/8") 25G X 5/8" 3 ML MISC USE AS DIRECTED WITH B-12 INJECTIONS  . [DISCONTINUED] Vitamin D, Ergocalciferol, (DRISDOL) 50000 units CAPS capsule Take 1 capsule (50,000 Units total) by mouth every 7 (seven) days.   No facility-administered encounter medications on file as of 12/20/2018.     Review of Systems  Constitutional: Negative for appetite change and unexpected weight change.  HENT: Negative for congestion and sinus pressure.   Respiratory: Negative for cough, chest tightness and shortness of breath.   Cardiovascular: Negative for chest pain, palpitations and leg swelling.  Gastrointestinal: Negative for abdominal pain, diarrhea, nausea and vomiting.  Genitourinary: Negative for difficulty urinating and dysuria.  Musculoskeletal: Negative for joint swelling and myalgias.  Skin: Negative for color change and rash.  Neurological: Negative for dizziness, light-headedness and headaches.  Psychiatric/Behavioral: Negative for agitation and dysphoric mood.       Objective:    Physical  Exam Constitutional:      General: She is not in acute distress.    Appearance: Normal appearance.  HENT:     Nose: Nose normal. No congestion.     Mouth/Throat:     Pharynx: No oropharyngeal exudate or posterior oropharyngeal erythema.  Neck:     Musculoskeletal: Neck supple. No muscular tenderness.     Thyroid: No thyromegaly.  Cardiovascular:     Rate and Rhythm: Normal rate and regular rhythm.  Pulmonary:     Effort: No respiratory distress.     Breath sounds: Normal breath sounds. No wheezing.  Abdominal:     General: Bowel sounds are normal.     Palpations: Abdomen is soft.     Tenderness: There is no abdominal tenderness.  Musculoskeletal:        General: No swelling or tenderness.  Lymphadenopathy:     Cervical: No cervical adenopathy.  Skin:    Findings: No erythema or rash.  Neurological:     Mental Status: She is alert.  Psychiatric:        Mood and Affect: Mood normal.        Behavior: Behavior normal.     BP 116/62 (BP Location: Left Arm, Patient Position: Sitting, Cuff Size: Normal)   Pulse 82   Temp 97.8 F (36.6 C) (Oral)   Resp 16   Wt 107 lb 12.8 oz (48.9 kg)   SpO2 96%   BMI 18.50 kg/m  Wt Readings from Last 3 Encounters:  12/20/18 107 lb 12.8 oz (48.9 kg)  08/20/18 106 lb 3.2 oz (48.2 kg)  04/18/18 104 lb 4 oz (47.3 kg)     Lab Results  Component Value Date   WBC 6.1 04/18/2018   HGB 13.9 04/18/2018   HCT 42.7 04/18/2018   PLT 255.0 04/18/2018   GLUCOSE 93 04/18/2018   CHOL 175 04/18/2018   TRIG 106.0 04/18/2018   HDL 75.40 04/18/2018   LDLCALC 78 04/18/2018   ALT 8 04/18/2018   AST 12 04/18/2018   NA 138 04/18/2018   K 3.9 04/18/2018   CL 101 04/18/2018   CREATININE 0.62 04/18/2018   BUN 8 04/18/2018   CO2 30 04/18/2018   TSH 1.17 04/18/2018   HGBA1C 6.0 (H) 03/07/2014        Assessment & Plan:   Problem List Items Addressed This Visit    A-fib ([place])    Remains in afib.  Rated controlled.  Again discussed further  treatment (anticoagulation) and w/up.  Declines.        Cognitive dysfunction    Off  aricept.  Daughter declines any further medication.       Hypercholesterolemia    Follow lipid panel.  Daughter declines labs today.       Osteoporosis    Desires no further evaluation or treatment.        Thyroid fullness    Had thyroid ultrasound.  Thyroid nodule.  Had her scheduled for biopsy.  Family cancelled.  Discussed again today.  Continues to decline any further w/up or evaluation.        Vitamin D deficiency    Follow vitamin D level.        Other Visit Diagnoses    Need for 23-polyvalent pneumococcal polysaccharide vaccine    -  Primary   Relevant Orders   Pneumococcal polysaccharide vaccine 23-valent greater than or equal to 2yo subcutaneous/IM (Completed)       Einar Pheasant, MD

## 2018-12-23 ENCOUNTER — Encounter: Payer: Self-pay | Admitting: Internal Medicine

## 2018-12-23 NOTE — Assessment & Plan Note (Signed)
Desires no further evaluation or treatment.

## 2018-12-23 NOTE — Assessment & Plan Note (Signed)
Remains in afib.  Rated controlled.  Again discussed further treatment (anticoagulation) and w/up.  Declines.

## 2018-12-23 NOTE — Assessment & Plan Note (Signed)
Follow lipid panel.  Daughter declines labs today.

## 2018-12-23 NOTE — Assessment & Plan Note (Signed)
Had thyroid ultrasound.  Thyroid nodule.  Had her scheduled for biopsy.  Family cancelled.  Discussed again today.  Continues to decline any further w/up or evaluation.

## 2018-12-23 NOTE — Assessment & Plan Note (Signed)
Follow vitamin D level.  

## 2018-12-23 NOTE — Assessment & Plan Note (Signed)
Off aricept.  Daughter declines any further medication.

## 2019-04-22 ENCOUNTER — Ambulatory Visit: Payer: PPO | Admitting: Internal Medicine

## 2019-05-20 ENCOUNTER — Ambulatory Visit: Payer: PPO | Admitting: Internal Medicine

## 2019-07-15 ENCOUNTER — Other Ambulatory Visit: Payer: Self-pay

## 2019-08-16 ENCOUNTER — Other Ambulatory Visit: Payer: Self-pay

## 2019-08-16 ENCOUNTER — Emergency Department: Payer: PPO

## 2019-08-16 ENCOUNTER — Inpatient Hospital Stay
Admission: EM | Admit: 2019-08-16 | Discharge: 2019-08-21 | DRG: 690 | Disposition: A | Discharge status: Skilled Nursing Facility | Payer: PPO | Attending: Internal Medicine | Admitting: Internal Medicine

## 2019-08-16 DIAGNOSIS — I482 Chronic atrial fibrillation, unspecified: Secondary | ICD-10-CM | POA: Diagnosis not present

## 2019-08-16 DIAGNOSIS — E0789 Other specified disorders of thyroid: Secondary | ICD-10-CM | POA: Diagnosis not present

## 2019-08-16 DIAGNOSIS — I4891 Unspecified atrial fibrillation: Secondary | ICD-10-CM | POA: Diagnosis not present

## 2019-08-16 DIAGNOSIS — M546 Pain in thoracic spine: Secondary | ICD-10-CM | POA: Diagnosis not present

## 2019-08-16 DIAGNOSIS — Z20828 Contact with and (suspected) exposure to other viral communicable diseases: Secondary | ICD-10-CM | POA: Diagnosis present

## 2019-08-16 DIAGNOSIS — Z66 Do not resuscitate: Secondary | ICD-10-CM | POA: Diagnosis not present

## 2019-08-16 DIAGNOSIS — R001 Bradycardia, unspecified: Secondary | ICD-10-CM | POA: Diagnosis not present

## 2019-08-16 DIAGNOSIS — N2889 Other specified disorders of kidney and ureter: Secondary | ICD-10-CM | POA: Diagnosis present

## 2019-08-16 DIAGNOSIS — W19XXXA Unspecified fall, initial encounter: Secondary | ICD-10-CM

## 2019-08-16 DIAGNOSIS — Z7401 Bed confinement status: Secondary | ICD-10-CM | POA: Diagnosis not present

## 2019-08-16 DIAGNOSIS — E538 Deficiency of other specified B group vitamins: Secondary | ICD-10-CM | POA: Diagnosis not present

## 2019-08-16 DIAGNOSIS — M810 Age-related osteoporosis without current pathological fracture: Secondary | ICD-10-CM | POA: Diagnosis not present

## 2019-08-16 DIAGNOSIS — T461X5A Adverse effect of calcium-channel blockers, initial encounter: Secondary | ICD-10-CM | POA: Diagnosis not present

## 2019-08-16 DIAGNOSIS — F329 Major depressive disorder, single episode, unspecified: Secondary | ICD-10-CM | POA: Diagnosis not present

## 2019-08-16 DIAGNOSIS — E7800 Pure hypercholesterolemia, unspecified: Secondary | ICD-10-CM | POA: Diagnosis not present

## 2019-08-16 DIAGNOSIS — Z85820 Personal history of malignant melanoma of skin: Secondary | ICD-10-CM

## 2019-08-16 DIAGNOSIS — R41841 Cognitive communication deficit: Secondary | ICD-10-CM | POA: Diagnosis not present

## 2019-08-16 DIAGNOSIS — R2689 Other abnormalities of gait and mobility: Secondary | ICD-10-CM | POA: Diagnosis not present

## 2019-08-16 DIAGNOSIS — N390 Urinary tract infection, site not specified: Secondary | ICD-10-CM | POA: Diagnosis not present

## 2019-08-16 DIAGNOSIS — M5489 Other dorsalgia: Secondary | ICD-10-CM | POA: Diagnosis not present

## 2019-08-16 DIAGNOSIS — Z90710 Acquired absence of both cervix and uterus: Secondary | ICD-10-CM

## 2019-08-16 DIAGNOSIS — S32019A Unspecified fracture of first lumbar vertebra, initial encounter for closed fracture: Secondary | ICD-10-CM | POA: Diagnosis present

## 2019-08-16 DIAGNOSIS — Z03818 Encounter for observation for suspected exposure to other biological agents ruled out: Secondary | ICD-10-CM | POA: Diagnosis not present

## 2019-08-16 DIAGNOSIS — E785 Hyperlipidemia, unspecified: Secondary | ICD-10-CM | POA: Diagnosis present

## 2019-08-16 DIAGNOSIS — E860 Dehydration: Secondary | ICD-10-CM | POA: Diagnosis not present

## 2019-08-16 DIAGNOSIS — M549 Dorsalgia, unspecified: Secondary | ICD-10-CM | POA: Diagnosis not present

## 2019-08-16 DIAGNOSIS — R52 Pain, unspecified: Secondary | ICD-10-CM | POA: Diagnosis not present

## 2019-08-16 DIAGNOSIS — W1830XA Fall on same level, unspecified, initial encounter: Secondary | ICD-10-CM | POA: Diagnosis present

## 2019-08-16 DIAGNOSIS — Y92009 Unspecified place in unspecified non-institutional (private) residence as the place of occurrence of the external cause: Secondary | ICD-10-CM | POA: Diagnosis not present

## 2019-08-16 DIAGNOSIS — M6281 Muscle weakness (generalized): Secondary | ICD-10-CM | POA: Diagnosis not present

## 2019-08-16 DIAGNOSIS — M545 Low back pain: Secondary | ICD-10-CM | POA: Diagnosis not present

## 2019-08-16 DIAGNOSIS — S32010D Wedge compression fracture of first lumbar vertebra, subsequent encounter for fracture with routine healing: Secondary | ICD-10-CM | POA: Diagnosis not present

## 2019-08-16 DIAGNOSIS — E876 Hypokalemia: Secondary | ICD-10-CM | POA: Diagnosis present

## 2019-08-16 DIAGNOSIS — F0390 Unspecified dementia without behavioral disturbance: Secondary | ICD-10-CM | POA: Diagnosis not present

## 2019-08-16 DIAGNOSIS — N3000 Acute cystitis without hematuria: Principal | ICD-10-CM | POA: Diagnosis present

## 2019-08-16 DIAGNOSIS — W19XXXD Unspecified fall, subsequent encounter: Secondary | ICD-10-CM | POA: Diagnosis not present

## 2019-08-16 DIAGNOSIS — C642 Malignant neoplasm of left kidney, except renal pelvis: Secondary | ICD-10-CM | POA: Diagnosis not present

## 2019-08-16 DIAGNOSIS — R0902 Hypoxemia: Secondary | ICD-10-CM | POA: Diagnosis not present

## 2019-08-16 DIAGNOSIS — R404 Transient alteration of awareness: Secondary | ICD-10-CM | POA: Diagnosis not present

## 2019-08-16 DIAGNOSIS — M2550 Pain in unspecified joint: Secondary | ICD-10-CM | POA: Diagnosis not present

## 2019-08-16 DIAGNOSIS — Z789 Other specified health status: Secondary | ICD-10-CM | POA: Diagnosis not present

## 2019-08-16 DIAGNOSIS — S299XXA Unspecified injury of thorax, initial encounter: Secondary | ICD-10-CM | POA: Diagnosis not present

## 2019-08-16 DIAGNOSIS — Z9181 History of falling: Secondary | ICD-10-CM | POA: Diagnosis not present

## 2019-08-16 DIAGNOSIS — S32010A Wedge compression fracture of first lumbar vertebra, initial encounter for closed fracture: Secondary | ICD-10-CM | POA: Diagnosis not present

## 2019-08-16 DIAGNOSIS — R109 Unspecified abdominal pain: Secondary | ICD-10-CM | POA: Diagnosis not present

## 2019-08-16 DIAGNOSIS — I10 Essential (primary) hypertension: Secondary | ICD-10-CM | POA: Diagnosis not present

## 2019-08-16 LAB — COMPREHENSIVE METABOLIC PANEL
ALT: 12 U/L (ref 0–44)
AST: 16 U/L (ref 15–41)
Albumin: 3.5 g/dL (ref 3.5–5.0)
Alkaline Phosphatase: 59 U/L (ref 38–126)
Anion gap: 9 (ref 5–15)
BUN: 14 mg/dL (ref 8–23)
CO2: 28 mmol/L (ref 22–32)
Calcium: 8.7 mg/dL — ABNORMAL LOW (ref 8.9–10.3)
Chloride: 108 mmol/L (ref 98–111)
Creatinine, Ser: 0.57 mg/dL (ref 0.44–1.00)
GFR calc Af Amer: >60 mL/min (ref >60)
GFR calc non Af Amer: >60 mL/min (ref >60)
Glucose, Bld: 101 mg/dL — ABNORMAL HIGH (ref 70–99)
Potassium: 4 mmol/L (ref 3.5–5.1)
Sodium: 145 mmol/L (ref 135–145)
Total Bilirubin: 0.7 mg/dL (ref 0.3–1.2)
Total Protein: 5.9 g/dL — ABNORMAL LOW (ref 6.5–8.1)

## 2019-08-16 LAB — CBC WITH DIFFERENTIAL/PLATELET
Abs Immature Granulocytes: 0.07 10*3/uL (ref 0.00–0.07)
Basophils Absolute: 0 10*3/uL (ref 0.0–0.1)
Basophils Relative: 0 %
Eosinophils Absolute: 0 10*3/uL (ref 0.0–0.5)
Eosinophils Relative: 1 %
HCT: 42.2 % (ref 36.0–46.0)
Hemoglobin: 13.2 g/dL (ref 12.0–15.0)
Immature Granulocytes: 1 %
Lymphocytes Relative: 14 %
Lymphs Abs: 1.1 10*3/uL (ref 0.7–4.0)
MCH: 28.4 pg (ref 26.0–34.0)
MCHC: 31.3 g/dL (ref 30.0–36.0)
MCV: 90.8 fL (ref 80.0–100.0)
Monocytes Absolute: 0.5 10*3/uL (ref 0.1–1.0)
Monocytes Relative: 6 %
Neutro Abs: 6.5 10*3/uL (ref 1.7–7.7)
Neutrophils Relative %: 78 %
Platelets: 245 10*3/uL (ref 150–400)
RBC: 4.65 MIL/uL (ref 3.87–5.11)
RDW: 14.6 % (ref 11.5–15.5)
WBC: 8.2 10*3/uL (ref 4.0–10.5)
nRBC: 0 % (ref 0.0–0.2)

## 2019-08-16 LAB — URINALYSIS, COMPLETE (UACMP) WITH MICROSCOPIC
Bacteria, UA: NONE SEEN
Bilirubin Urine: NEGATIVE
Glucose, UA: NEGATIVE mg/dL
Hgb urine dipstick: NEGATIVE
Ketones, ur: 5 mg/dL — AB
Nitrite: NEGATIVE
Protein, ur: NEGATIVE mg/dL
Specific Gravity, Urine: >1.046 — ABNORMAL HIGH (ref 1.005–1.030)
pH: 5 (ref 5.0–8.0)

## 2019-08-16 MED ORDER — ACETAMINOPHEN 500 MG PO TABS
1000.0000 mg | ORAL_TABLET | Freq: Once | ORAL | Status: AC
  Administered 2019-08-16: 1000 mg via ORAL
  Filled 2019-08-16: qty 2

## 2019-08-16 MED ORDER — OXYCODONE HCL 5 MG PO TABS
2.5000 mg | ORAL_TABLET | Freq: Three times a day (TID) | ORAL | Status: DC | PRN
  Administered 2019-08-17 – 2019-08-18 (×4): 2.5 mg via ORAL
  Filled 2019-08-16 (×8): qty 1

## 2019-08-16 MED ORDER — ACETAMINOPHEN 325 MG PO TABS
650.0000 mg | ORAL_TABLET | ORAL | Status: DC | PRN
  Administered 2019-08-17 – 2019-08-18 (×5): 650 mg via ORAL
  Filled 2019-08-16 (×5): qty 2

## 2019-08-16 MED ORDER — OXYCODONE-ACETAMINOPHEN 5-325 MG PO TABS
1.0000 | ORAL_TABLET | Freq: Once | ORAL | Status: AC
  Administered 2019-08-16: 15:00:00 1 via ORAL
  Filled 2019-08-16: qty 1

## 2019-08-16 MED ORDER — CEPHALEXIN 500 MG PO CAPS
500.0000 mg | ORAL_CAPSULE | Freq: Two times a day (BID) | ORAL | Status: DC
  Administered 2019-08-16 – 2019-08-17 (×3): 500 mg via ORAL
  Filled 2019-08-16 (×3): qty 1

## 2019-08-16 MED ORDER — IOHEXOL 300 MG/ML  SOLN
75.0000 mL | Freq: Once | INTRAMUSCULAR | Status: AC | PRN
  Administered 2019-08-16: 75 mL via INTRAVENOUS

## 2019-08-16 MED ORDER — LIDOCAINE 5 % EX PTCH
1.0000 | MEDICATED_PATCH | CUTANEOUS | Status: AC
  Administered 2019-08-16 – 2019-08-20 (×5): 1 via TRANSDERMAL
  Filled 2019-08-16 (×7): qty 1

## 2019-08-16 MED ORDER — NAPROXEN 500 MG PO TABS: 250.0000 mg | ORAL_TABLET | Freq: Two times a day (BID) | ORAL | Status: DC

## 2019-08-16 NOTE — ED Notes (Signed)
Pt requesting food at this time but unsure what she wanted to eat. RN listed all options and pt chose ice cream. Ice cream given.

## 2019-08-16 NOTE — ED Notes (Signed)
ED called [facility] to inquire about wait time. No definitive time given but company reports they will look into the delay.

## 2019-08-16 NOTE — ED Notes (Signed)
Patient transported to CT 

## 2019-08-16 NOTE — TOC Initial Note (Signed)
Transition of Care (TOC) - Initial/Assessment Note    Patient Details  Name: Denise Garrison MRN: [id number] Date of Birth: Jul 13, 1923  Transition of Care [facility]) CM/SW Contact:    Claudine Mouton, LCSW Phone Number: 08/16/2019, 11:35 PM  Clinical Narrative:   CSW called back and spoke to pt's RN who stated pt was asleep and family was no longer bedside.  Per notes, pt's EDP states pt needs placement and per notes pt is from [facility], an independent living facility in [place], [place].                 Expected Discharge Plan: Skilled Nursing Facility Barriers to Discharge: Insurance Authorization   Patient Goals and CMS Choice Patient states their goals for this hospitalization and ongoing recovery are:: Unable to speak to pt      Expected Discharge Plan and Services Expected Discharge Plan: [place] In-house Referral: Clinical Social Work                                            Prior Living Arrangements/Services     Patient language and need for interpreter reviewed:: No                 Activities of Daily Living      Permission Sought/Granted Permission sought to share information with : Facility Art therapist granted to share information with : No              Emotional Assessment       Orientation: : (Per notes, pt A&OX4)      Admission diagnosis:  fall Patient Active Problem List   Diagnosis Date Noted  . Vitamin D deficiency 07/05/2017  . A-fib ([place]) 01/08/2017  . Thyroid fullness 03/09/2014  . Hypercholesterolemia 11/11/2012  . Osteoporosis 11/11/2012  . B12 deficiency 11/11/2012  . Cognitive dysfunction 11/11/2012   PCP:  Einar Pheasant, MD Pharmacy:   [place], [place] - [address] [place] [address] Phone: [phone] Fax: [phone]     Social Determinants of Health (SDOH) Interventions    Readmission Risk  Interventions No flowsheet data found.

## 2019-08-16 NOTE — ED Notes (Signed)
Pt resting in bed with family at bedside. Bed locked and in lowest position with call bell in reach. Pt denies needing anything at this time. NAD noted.

## 2019-08-16 NOTE — ED Provider Notes (Signed)
[facility] Emergency Department Provider Note       Time seen: ----------------------------------------- 9:09 AM on 08/16/2019 -----------------------------------------   I have reviewed the triage vital signs and the nursing notes.  HISTORY   Chief Complaint No chief complaint on file.    HPI Denise Garrison is a 85 y.o. female with a history of cancer, hiatal hernia, hyperlipidemia, osteoporosis who presents to the ED for a fall.  Patient appeared to have an witnessed fall from the bed at [facility] but she remembers the whole event.  She states she did not really hurt herself.  The nurses attended to her immediately.  She has pain in her low back.  She had describes some pain 10 out of 10 in her low back.  Past Medical History:  Diagnosis Date  . B12 deficiency   . Cancer [facility])    history of melanoma, s/p excision x 3  . Cavernous hemangioma    History of  . Hiatal hernia    History of  . Hx of detached retina repair   . Hypercholesterolemia   . Osteoporosis     Patient Active Problem List   Diagnosis Date Noted  . Vitamin D deficiency 07/05/2017  . A-fib ([place]) 01/08/2017  . Thyroid fullness 03/09/2014  . Hypercholesterolemia 11/11/2012  . Osteoporosis 11/11/2012  . B12 deficiency 11/11/2012  . Cognitive dysfunction 11/11/2012    Past Surgical History:  Procedure Laterality Date  . ABDOMINAL HYSTERECTOMY  1975   Secondary to fibroid tumors  . BREAST BIOPSY     Benign  . CHOLECYSTECTOMY  4/91  . EYE SURGERY     Bilateral Cataract   . SKIN CANCER EXCISION     Malignant Melanoma x3    Allergies Patient has no known allergies.  Social History Social History   Tobacco Use  . Smoking status: Never Smoker  . Smokeless tobacco: Never Used  Substance Use Topics  . Alcohol use: Yes    Comment: occasional wine, rare  . Drug use: No   Review of Systems Constitutional: Negative for fever. Cardiovascular: Negative for chest  pain. Respiratory: Negative for shortness of breath. Gastrointestinal: Negative for abdominal pain Musculoskeletal: Positive for back pain Skin: Negative for rash. Neurological: Negative for headaches, focal weakness or numbness.  All systems negative/normal/unremarkable except as stated in the HPI  ____________________________________________   PHYSICAL EXAM:  VITAL SIGNS: ED Triage Vitals  Enc Vitals Group     BP      Pulse      Resp      Temp      Temp src      SpO2      Weight      Height      Head Circumference      Peak Flow      Pain Score      Pain Loc      Pain Edu?      Excl. in [place]?    Constitutional: No acute distress Eyes: Conjunctivae are normal. Normal extraocular movements. Cardiovascular: Normal rate, regular rhythm. No murmurs, rubs, or gallops. Respiratory: Normal respiratory effort without tachypnea nor retractions. Breath sounds are clear and equal bilaterally. No wheezes/rales/rhonchi. Gastrointestinal: Soft and nontender. Normal bowel sounds Musculoskeletal: Nonfocal lower back tenderness, normal range of motion of the lower extremities Neurologic:  Normal speech and language. No gross focal neurologic deficits are appreciated.  Skin:  Skin is warm, dry and intact. No rash noted. Psychiatric: Mood and affect are  normal. Speech and behavior are normal.  ____________________________________________  ED COURSE:  As part of my medical decision making, I reviewed the following data within the [place] History obtained from family if available, nursing notes, old chart and ekg, as well as notes from prior ED visits. Patient presented for a fall, we will assess with labs and imaging as indicated at this time.   Procedures  Denise Garrison was evaluated in Emergency Department on 08/16/2019 for the symptoms described in the history of present illness. She was evaluated in the context of the global COVID-19 pandemic, which necessitated  consideration that the patient might be at risk for infection with the SARS-CoV-2 virus that causes COVID-19. Institutional protocols and algorithms that pertain to the evaluation of patients at risk for COVID-19 are in a state of rapid change based on information released by regulatory bodies including the CDC and federal and state organizations. These policies and algorithms were followed during the patient's care in the ED.  ____________________________________________   LABS (pertinent positives/negatives)  Labs Reviewed  COMPREHENSIVE METABOLIC PANEL - Abnormal; Notable for the following components:      Result Value   Glucose, Bld 101 (*)    Calcium 8.7 (*)    Total Protein 5.9 (*)    All other components within normal limits  CBC WITH DIFFERENTIAL/PLATELET  URINALYSIS, COMPLETE (UACMP) WITH MICROSCOPIC    RADIOLOGY Images were viewed by me  Thoracic, lumbar spine, pelvis x-rays  IMPRESSION:  1. Acute L1 compression deformity, with ventral canal encroachment.  2. Bilateral renal lesions. Left-sided lesion is likely renal cell  carcinoma. The right-sided lesion is favored to represent a central  renal cell carcinoma versus less likely urothelial carcinoma. There  is also a smaller left-sided indeterminate renal lesion. If this  patient is a treatment candidate, consider dedicated outpatient pre  and post contrast abdominal MRI.  3. No evidence of abdominal metastasis. Patent renal veins.  4. Tiny left and trace right pleural effusions.  5. "Giant" right hepatic lobe hemangioma.  6. Pelvic floor laxity.  7. Aortic Atherosclerosis (ICD10-I70.0).   These results were called by telephone at the time of interpretation  on 08/16/2019 at 12:49 pm to Dr. Lenise Arena , who verbally  acknowledged these results.  ____________________________________________   DIFFERENTIAL DIAGNOSIS   Fall, contusion, fracture, dislocation  FINAL ASSESSMENT AND PLAN  Fall, low back pain,  L1 compression fracture, bilateral renal lesions   Plan: The patient had presented for a fall with low back pain. Patient's labs were remarkably normal. Patient's imaging did reveal an L1 compression fracture which she was tolerating well.  Also more concerning would be bilateral likely renal cell carcinoma.  Family is unsure if they will pursue this or not given her advanced age and dementia.  She will be referred to oncology for follow-up.   Laurence Aly, MD    Note: This note was generated in part or whole with voice recognition software. Voice recognition is usually quite accurate but there are transcription errors that can and very often do occur. I apologize for any typographical errors that were not detected and corrected.     Earleen Newport, MD 08/16/19 1315

## 2019-08-16 NOTE — ED Notes (Signed)
Ortho tech called ED to report they were on their way to ED to place brace. Estimated 25 minutes until arrival.

## 2019-08-16 NOTE — ED Provider Notes (Signed)
Patient here with back pain from L1 compression fx. Persistent pain and will need placement given age, difficulty ambulating. PT has been consulted. Added tylenol and oxycodone w/ lidocaine patch for pain control. Holding on NSAIDs in setting of renal cell CA, though renal function is wnl.   Duffy Bruce, MD 08/16/19 1544

## 2019-08-16 NOTE — ED Notes (Signed)
RN attempted to ambulate pt. Pt was unable to roll without assistance and unable to sit up without assistance. Pt reporting she can not stand with the pain she is feeling at this time. Pt normally ambulates independently and only uses a walker on occasion per pt and family. MD made aware.

## 2019-08-16 NOTE — ED Triage Notes (Signed)
From [facility], unwitnessed fall from bed, remembers the whole event, no loc, nurses attended to her immediately, pain in lower lumbar region, stomach hurting, rates at 10/10, no 12 lead, VS normal, no blood thinners, no major medical problems.

## 2019-08-16 NOTE — ED Notes (Addendum)
Pt able to sit up independently and hable to ambulate with assistance. Pt reporting severe back pain when walking but mobility has significantly improved.   After going to the bathroom pt was unable to stand from the toilet without assistance which is reported to be new since injury.

## 2019-08-16 NOTE — Progress Notes (Signed)
CSW covering from [facility] ED attempted to contact RN X 2 to speak to pt to gather collateral for an assessment but RN's had switched phones and ED CSW was asked top call back.  11:31 PM CSW called back and spoke to pt's RN who stated pt was asleep and family was no longer bedside.  Per notes, pt's EDP states pt needs placement and per notes pt is from [facility], an independent living facility in [place], [place].  2nd shift ED CSW will leave handoff for 1st shift ED CSW.  Marland KitchenPlease reconsult if future social work needs arise.  CSW signing off, as social work intervention is no longer needed.  Alphonse Guild. Glenwood Revoir, LCSW, LCAS, CSI Transitions of Care Clinical Social Worker Care Coordination Department Ph: [phone]

## 2019-08-17 NOTE — ED Notes (Signed)
Pt resting at this time and in NAD.

## 2019-08-17 NOTE — ED Notes (Signed)
PT at bedside.

## 2019-08-17 NOTE — ED Notes (Signed)
Pt resting in bed. Easily arousable.  Alert and oriented to person. Easily reoriented at this time. Pt denies pain or discomfort.

## 2019-08-17 NOTE — ED Notes (Signed)
Pt resting in bed with out complaints. Daughter at bedside.

## 2019-08-17 NOTE — ED Notes (Signed)
Patient repositioned self and set off bed alarm. Patient was confused about where she was and couldn't remember her daughter being at bedside. Patient was reoriented to place and situation and accepted reorientation well.

## 2019-08-17 NOTE — Evaluation (Signed)
Physical Therapy Evaluation Patient Details Name: Denise Garrison. Lumpkins MRN: [id number] DOB: 04-15-1923 Today's Date: 08/17/2019   History of Present Illness  Denise Garrison is a 50yoF who comes to [facility] after a fall at home, found to have acute L1 fracture. Pt issued TLSO, CSW searching for bed placement, as pt lived alone PTA adn now is having difficulty AMB d/t back pain.  Clinical Impression  Pt admitted with above diagnosis. Pt currently with functional limitations due to the deficits listed below (see "PT Problem List"). Upon entry, pt in bed, awake and agreeable to participate after some encouragement. The pt is alert and oriented to self only, quite pleasant, conversational, and generally unable to provide much history. DTR in room gives detail as needed. Pt forgetful, disoriented to back fracture multiple times in session, reminded of and explained importance of mobility. DTR reports pt has really responded to pain meds early with near resolution of pain at rest, but was having some visual hallucination. Earlier in day, pt really struggling with AMB or rising to standing d/t pain. Pt/DTR educated on TLSO donning and use, as well as log roll. ModA needed for bed mobility. Pt comes to standing with RW at minGuard assist. AMB of 121ft slowly with RW, pt frequently distracted by pulse oximeter sensor on finger, RN in hallway, or other patient rooms in ED, often reverting to single UE propulsion of RW momentarily which results in a curvilinear moment and mild LOB with patient. Pt encouraged to 'keep both hands on the wheel." Functional mobility assessment demonstrates increased effort/time requirements, poor tolerance, and need for physical assistance, whereas the patient performed these at a higher level of independence PTA. Cognition limitations are close to baseline per DTR will absolutely be limited in the patient's ability to independently be safe while acute fracture is heeling. Pt unable to attend to back  precautions, log rolling, use of RW, nor use of TLSO at this time, and thus would benefit from 24/7 supervision for safety and continued educational intervention and safe mobilization with PT to ascertain learning potential for future independence. Pt will benefit from skilled PT intervention to increase independence and safety with basic mobility in preparation for discharge to the venue listed below.       Follow Up Recommendations Supervision/Assistance - 24 hour;Supervision for mobility/OOB;SNF    Equipment Recommendations  Other (comment)(youth RW; TLSO (already issued))    Recommendations for Other Services       Precautions / Restrictions Precautions Precautions: Fall Precaution Comments: presumptive back precautions, as TLSO resists trunk flexion, pt will be asked to avoid trunk flexion when in bed and TLSO is doffed. Required Braces or Orthoses: Spinal Brace Spinal Brace: Thoracolumbosacral orthotic      Mobility  Bed Mobility Overal bed mobility: Needs Assistance Bed Mobility: Supine to Sit;Sit to Supine     Supine to sit: Mod assist Sit to supine: Mod assist   General bed mobility comments: heavy cues teaching log rolling technique. More difficulty returning to supine, as pain is more anxiety provoking in sitting EOB.  Transfers Overall transfer level: Needs assistance Equipment used: Rolling walker (2 wheeled)(youthRW) Transfers: Sit to/from Stand           General transfer comment: dependent on donning and education for TLSO prior to xfer/amb adn doffing prior to supining.  Ambulation/Gait Ambulation/Gait assistance: Min guard Gait Distance (Feet): 150 Feet Assistive device: Rolling walker (2 wheeled);None(youth RW)     Gait velocity interpretation: <1.31 ft/sec, indicative of household ambulator General  Gait Details: slow, distracted, constant supervision for redirection and safety. intermittent pain limitations which she can improve by standing  upright.  Stairs            Wheelchair Mobility    Modified Rankin (Stroke Patients Only)       Balance Overall balance assessment: No apparent balance deficits (not formally assessed);Modified Independent                                           Pertinent Vitals/Pain Pain Assessment: Faces Faces Pain Scale: Hurts little more Pain Location: low back; pt forgets her acute fracture is confused by her pain at times Pain Intervention(s): Limited activity within patient's tolerance;Monitored during session;Premedicated before session;Repositioned    Home Living Family/patient expects to be discharged to:: Private residence(ILF at [facility]) Living Arrangements: Alone Available Help at Discharge: Family Type of Home: Apartment Home Access: Elevator     Home Layout: One level Home Equipment: Cane - single point      Prior Function Level of Independence: Independent with assistive device(s)         Comments: fairly mobile and independent prior to recent fall, SPC for limited community distances, no falls history per DTR.     Hand Dominance        Extremity/Trunk Assessment                Communication   Communication: HOH  Cognition Arousal/Alertness: Awake/alert Behavior During Therapy: Restless;Impulsive Overall Cognitive Status: History of cognitive impairments - at baseline                                 General Comments: DTR reports some mild dementia at baseline, but pt has been modified independent living alone PTA. DTR endorses pt having visual hallucination ealier this date. Pt is distracted throughout, difficulty to attend to most tasks, needs constant redirection and encouragement for safety.      General Comments      Exercises     Assessment/Plan    PT Assessment Patient needs continued PT services  PT Problem List Decreased strength;Decreased range of motion;Decreased  cognition;Decreased activity tolerance;Decreased mobility       PT Treatment Interventions DME instruction;Gait training;Functional mobility training;Therapeutic activities;Therapeutic exercise;Balance training;Neuromuscular re-education;Cognitive remediation;Patient/family education    PT Goals (Current goals can be found in the Care Plan section)  Acute Rehab PT Goals Patient Stated Goal: return to independent mobility. PT Goal Formulation: With family Time For Goal Achievement: 08/31/19 Potential to Achieve Goals: Good    Frequency 7X/week   Barriers to discharge        Co-evaluation               AM-PAC PT "6 Clicks" Mobility  Outcome Measure Help needed turning from your back to your side while in a flat bed without using bedrails?: A Lot Help needed moving from lying on your back to sitting on the side of a flat bed without using bedrails?: A Lot Help needed moving to and from a bed to a chair (including a wheelchair)?: A Lot Help needed standing up from a chair using your arms (e.g., wheelchair or bedside chair)?: A Little Help needed to walk in hospital room?: A Little Help needed climbing 3-5 steps with a railing? : A Lot 6 Click Score: 14  End of Session Equipment Utilized During Treatment: Gait belt;Back brace Activity Tolerance: Patient tolerated treatment well;Patient limited by fatigue Patient left: in bed;with call bell/phone within reach;with family/visitor present Nurse Communication: Mobility status PT Visit Diagnosis: Other abnormalities of gait and mobility (R26.89);Difficulty in walking, not elsewhere classified (R26.2);Other symptoms and signs involving the nervous system (R29.898);Muscle weakness (generalized) (M62.81)    Time: [id number] PT Time Calculation (min) (ACUTE ONLY): 20 min   Charges:   PT Evaluation $PT Eval Moderate Complexity: 1 Mod          5:04 PM, 08/17/19 Etta Grandchild, PT, DPT Physical Therapist - [facility]  [phone] ([place])    , C 08/17/2019, 4:54 PM

## 2019-08-17 NOTE — ED Notes (Signed)
Patient's daughter is assisting the patient with the meal tray. Social Work at bedside.

## 2019-08-17 NOTE — ED Notes (Addendum)
Bed alarm is activated. Blinds are open. Daughter is no longer at bedside.

## 2019-08-17 NOTE — ED Notes (Signed)
Pt ambulatory to BR with assist. Pt placed in wheelchair in order to get back into room.

## 2019-08-17 NOTE — ED Notes (Signed)
Patient is sleeping in darkened room. Accent light is on for visibility. Blinds are open. Patient repositioned self on stretcher. Rise and fall of chest noted.

## 2019-08-17 NOTE — ED Notes (Signed)
Message left for PT consult.

## 2019-08-17 NOTE — ED Notes (Signed)
Pt placed on hospital bed for comfort. Pt stood and walked to bed with standby assistance.

## 2019-08-17 NOTE — Social Work (Addendum)
TOC CM/SW received handoff. Notes reviewed.  Patient needs higher level of care: [facility] nursing facility - Assessment in progress.   1118: ED SW consulted with the patient's daughter, Pamala Hurry. SNF preferred for this patient.    Berenice Bouton, MSW, LCSW  8am-6pm (weekends) # [phone]

## 2019-08-17 NOTE — ED Notes (Signed)
Pt sleeping at this time with rise and fall of chest noted by this RN.

## 2019-08-18 ENCOUNTER — Other Ambulatory Visit: Payer: Self-pay

## 2019-08-18 ENCOUNTER — Encounter: Payer: Self-pay | Admitting: Radiology

## 2019-08-18 DIAGNOSIS — Y92009 Unspecified place in unspecified non-institutional (private) residence as the place of occurrence of the external cause: Secondary | ICD-10-CM | POA: Diagnosis not present

## 2019-08-18 DIAGNOSIS — Z20828 Contact with and (suspected) exposure to other viral communicable diseases: Secondary | ICD-10-CM | POA: Diagnosis present

## 2019-08-18 DIAGNOSIS — F0390 Unspecified dementia without behavioral disturbance: Secondary | ICD-10-CM | POA: Diagnosis present

## 2019-08-18 DIAGNOSIS — E538 Deficiency of other specified B group vitamins: Secondary | ICD-10-CM | POA: Diagnosis present

## 2019-08-18 DIAGNOSIS — C642 Malignant neoplasm of left kidney, except renal pelvis: Secondary | ICD-10-CM | POA: Diagnosis present

## 2019-08-18 DIAGNOSIS — N390 Urinary tract infection, site not specified: Secondary | ICD-10-CM | POA: Diagnosis present

## 2019-08-18 DIAGNOSIS — N2889 Other specified disorders of kidney and ureter: Secondary | ICD-10-CM | POA: Diagnosis present

## 2019-08-18 DIAGNOSIS — S32010A Wedge compression fracture of first lumbar vertebra, initial encounter for closed fracture: Secondary | ICD-10-CM | POA: Diagnosis present

## 2019-08-18 DIAGNOSIS — Z85820 Personal history of malignant melanoma of skin: Secondary | ICD-10-CM | POA: Diagnosis not present

## 2019-08-18 DIAGNOSIS — Z66 Do not resuscitate: Secondary | ICD-10-CM | POA: Diagnosis present

## 2019-08-18 DIAGNOSIS — I482 Chronic atrial fibrillation, unspecified: Secondary | ICD-10-CM | POA: Diagnosis present

## 2019-08-18 DIAGNOSIS — W1830XA Fall on same level, unspecified, initial encounter: Secondary | ICD-10-CM | POA: Diagnosis present

## 2019-08-18 DIAGNOSIS — Z90710 Acquired absence of both cervix and uterus: Secondary | ICD-10-CM | POA: Diagnosis not present

## 2019-08-18 DIAGNOSIS — E860 Dehydration: Secondary | ICD-10-CM | POA: Diagnosis present

## 2019-08-18 DIAGNOSIS — T461X5A Adverse effect of calcium-channel blockers, initial encounter: Secondary | ICD-10-CM | POA: Diagnosis present

## 2019-08-18 DIAGNOSIS — E7800 Pure hypercholesterolemia, unspecified: Secondary | ICD-10-CM | POA: Diagnosis present

## 2019-08-18 DIAGNOSIS — E785 Hyperlipidemia, unspecified: Secondary | ICD-10-CM | POA: Diagnosis present

## 2019-08-18 DIAGNOSIS — E876 Hypokalemia: Secondary | ICD-10-CM | POA: Diagnosis present

## 2019-08-18 DIAGNOSIS — S32019A Unspecified fracture of first lumbar vertebra, initial encounter for closed fracture: Secondary | ICD-10-CM | POA: Diagnosis present

## 2019-08-18 DIAGNOSIS — N3000 Acute cystitis without hematuria: Secondary | ICD-10-CM | POA: Diagnosis present

## 2019-08-18 DIAGNOSIS — M810 Age-related osteoporosis without current pathological fracture: Secondary | ICD-10-CM | POA: Diagnosis present

## 2019-08-18 DIAGNOSIS — R001 Bradycardia, unspecified: Secondary | ICD-10-CM | POA: Diagnosis present

## 2019-08-18 MED ORDER — SODIUM CHLORIDE 0.9 % IV SOLN
1.0000 g | INTRAVENOUS | Status: DC
  Administered 2019-08-19 – 2019-08-20 (×2): 1 g via INTRAVENOUS
  Filled 2019-08-18 (×2): qty 1
  Filled 2019-08-18: qty 10

## 2019-08-18 MED ORDER — ONDANSETRON HCL 4 MG/2ML IJ SOLN: 4.0000 mg | Freq: Four times a day (QID) | INTRAMUSCULAR | Status: DC | PRN

## 2019-08-18 MED ORDER — SODIUM CHLORIDE 0.9 % IV SOLN
1.0000 g | Freq: Once | INTRAVENOUS | Status: AC
  Administered 2019-08-18: 20:00:00 1 g via INTRAVENOUS
  Filled 2019-08-18: qty 10

## 2019-08-18 MED ORDER — OXYCODONE HCL 5 MG PO TABS
5.0000 mg | ORAL_TABLET | Freq: Four times a day (QID) | ORAL | Status: DC | PRN
  Administered 2019-08-18: 5 mg via ORAL
  Filled 2019-08-18: qty 1

## 2019-08-18 MED ORDER — ACETAMINOPHEN 650 MG RE SUPP: 650.0000 mg | Freq: Four times a day (QID) | RECTAL | Status: DC | PRN

## 2019-08-18 MED ORDER — SODIUM CHLORIDE 0.9 % IV SOLN
Freq: Once | INTRAVENOUS | Status: AC
  Administered 2019-08-18: 20:00:00 via INTRAVENOUS

## 2019-08-18 MED ORDER — SODIUM CHLORIDE 0.9 % IV BOLUS
500.0000 mL | Freq: Once | INTRAVENOUS | Status: AC
  Administered 2019-08-18: 500 mL via INTRAVENOUS

## 2019-08-18 MED ORDER — ACETAMINOPHEN 325 MG PO TABS: 650.0000 mg | ORAL_TABLET | Freq: Four times a day (QID) | ORAL | Status: DC | PRN

## 2019-08-18 MED ORDER — SODIUM CHLORIDE 0.9 % IV BOLUS: 1000.0000 mL | Freq: Once | INTRAVENOUS | Status: DC

## 2019-08-18 MED ORDER — ENOXAPARIN SODIUM 40 MG/0.4ML ~~LOC~~ SOLN
40.0000 mg | SUBCUTANEOUS | Status: DC
  Administered 2019-08-18: 40 mg via SUBCUTANEOUS
  Filled 2019-08-18: qty 0.4

## 2019-08-18 MED ORDER — ONDANSETRON HCL 4 MG PO TABS: 4.0000 mg | ORAL_TABLET | Freq: Four times a day (QID) | ORAL | Status: DC | PRN

## 2019-08-18 MED ORDER — MORPHINE SULFATE (PF) 2 MG/ML IV SOLN: 2.0000 mg | INTRAVENOUS | Status: DC | PRN

## 2019-08-18 MED ORDER — SODIUM CHLORIDE 0.9 % IV SOLN
INTRAVENOUS | Status: AC
  Administered 2019-08-18 – 2019-08-19 (×2): via INTRAVENOUS

## 2019-08-18 NOTE — ED Provider Notes (Signed)
Patient has been in the ER for over 2 days.  Having intermittent episodes of tachycardia urine spec gravity significantly elevated concerning for dehydration.  Also has evidence of moderate leukocytes and many bacteria.  Will give dose of Rocephin.  At this point do believe she her comorbidities frailty factors as tachycardia and dehydration meet criteria for hospitalization.  Will discuss with hospitalist.   Merlyn Lot, MD 08/18/19 1909

## 2019-08-18 NOTE — ED Notes (Signed)
Denise Garrison, [phone], pt's daughter and reported HPOA ("should be first contact"); updated with POC

## 2019-08-18 NOTE — ED Notes (Signed)
meds given crushed in applesauce

## 2019-08-18 NOTE — ED Notes (Signed)
Notified patient's daughter, Dannette Barbara of patient's admission and room assisgnment

## 2019-08-18 NOTE — Progress Notes (Signed)
PT Cancellation Note  Patient Details Name: Denise Garrison MRN: [id number] DOB: April 06, 1923   Cancelled Treatment:    Reason Eval/Treat Not Completed: Fatigue/lethargy limiting ability to participate;Patient's level of consciousness. Per chart review and speaking with nursing pt was up every hour through the night and did not get any sleep. Nursing reports they are trying to let her rest now that she is finally sleeping and they are holding medication and meals at this time to allow for pts rest and that PT should hold for today. PT will follow up tomorrow as appropriate.  Zachary George PT, DPT 1:53 PM,08/18/19 [phone]

## 2019-08-18 NOTE — ED Notes (Signed)
This RN received a call from the pt's daughter [name] for an update. RN informed her that pt woke briefly and fell back to sleep. Daughter asked that when pt is awake that we encourage her to drink cranberry juice.

## 2019-08-18 NOTE — ED Notes (Addendum)
Pt's daughter came to visit but chose to leave because pt sleeping. Pt did not sleep during the night per night shift RN and daughter had been informed. Daughter informed that medication held to avoid waking pt and meals not offered to allow pt to sleep. Daughter agreed that pt should be allowed to sleep.

## 2019-08-18 NOTE — ED Notes (Signed)
Assisted patient with using the bedpan. Patient was reoriented to where she was. Patient is calm and cooperative and easily went back to sleep when directed.

## 2019-08-18 NOTE — ED Notes (Signed)
Report given to Noel RN 

## 2019-08-18 NOTE — ED Notes (Signed)
Patient was restless, c/o back pain. Patient given prn pain med. Patient was placed on bedpan, but could not void. Patient is confused to place and situation, but remains cooperative. Bed alarm is on. Patient repositioned on hospital bed.

## 2019-08-18 NOTE — ED Notes (Signed)
ED TO INPATIENT HANDOFF REPORT  ED Nurse Name and Phone #:   [phone] [place] Name/Age/Gender Denise Garrison 85 y.o. female Room/Bed: ED33A/ED33A  Code Status   Code Status: Not on file  Home/SNF/Other Skilled nursing facility Patient oriented to: self Is this baseline? Yes   Triage Complete: Triage complete  Chief Complaint fall  Triage Note From [facility], unwitnessed fall from bed, remembers the whole event, no loc, nurses attended to her immediately, pain in lower lumbar region, stomach hurting, rates at 10/10, no 12 lead, VS normal, no blood thinners, no major medical problems.   Allergies No Known Allergies  Level of Care/Admitting Diagnosis ED Disposition    ED Disposition Condition [facility] Area: [place] [[id number]]  Level of Care: Med-Surg [16]  Covid Evaluation: Asymptomatic Screening Protocol (No Symptoms)  Diagnosis: UTI (urinary tract infection) [id number]  Admitting Physician: Lance Coon [id number]  Attending Physician: Lance Coon [phone]  Estimated length of stay: past midnight tomorrow  Certification:: I certify this patient will need inpatient services for at least 2 midnights  PT Class (Do Not Modify): Inpatient [101]  PT Acc Code (Do Not Modify): Private [1]       B Medical/Surgery History Past Medical History:  Diagnosis Date  . B12 deficiency   . Cancer [facility])    history of melanoma, s/p excision x 3  . Cavernous hemangioma    History of  . Hiatal hernia    History of  . Hx of detached retina repair   . Hypercholesterolemia   . Osteoporosis    Past Surgical History:  Procedure Laterality Date  . ABDOMINAL HYSTERECTOMY  1975   Secondary to fibroid tumors  . BREAST BIOPSY     Benign  . CHOLECYSTECTOMY  4/91  . EYE SURGERY     Bilateral Cataract   . SKIN CANCER EXCISION     Malignant Melanoma x3     A IV Location/Drains/Wounds Patient Lines/Drains/Airways Status   Active  Line/Drains/Airways    Name:   Placement date:   Placement time:   Site:   Days:   Peripheral IV 08/18/19 Left Antecubital   08/18/19    1932    Antecubital   less than 1          Intake/Output Last 24 hours No intake or output data in the 24 hours ending 08/18/19 2114  Labs/Imaging No results found for this or any previous visit (from the past 32 hour(s)). No results found.  Pending Labs Unresulted Labs (From admission, onward)    Start     Ordered   08/18/19 2030  Urine culture  Add-on,   AD     08/18/19 2029   08/18/19 1906  SARS CORONAVIRUS 2 (TAT 6-24 HRS) Nasopharyngeal Nasopharyngeal Swab  (Asymptomatic/Tier 2 Patients Labs)  ONCE - STAT,   STAT    Question Answer Comment  Is this test for diagnosis or screening Screening   Symptomatic for COVID-19 as defined by CDC No   Hospitalized for COVID-19 No   Admitted to ICU for COVID-19 No   Previously tested for COVID-19 No   Resident in a congregate (group) care setting Unknown   Employed in healthcare setting Unknown   Pregnant No      08/18/19 1905   Signed and Held  CBC  (enoxaparin (LOVENOX)    CrCl >/= 30 ml/min)  Once,   R    Comments: Baseline for enoxaparin therapy IF  NOT ALREADY DRAWN.  Notify MD if PLT < 100 K.    Signed and Held   Signed and Held  Creatinine, serum  (enoxaparin (LOVENOX)    CrCl >/= 30 ml/min)  Once,   R    Comments: Baseline for enoxaparin therapy IF NOT ALREADY DRAWN.    Signed and Held   Signed and Held  Creatinine, serum  (enoxaparin (LOVENOX)    CrCl >/= 30 ml/min)  Weekly,   R    Comments: while on enoxaparin therapy    Signed and Held   Signed and Held  Basic metabolic panel  Tomorrow morning,   R     Signed and Held   Signed and Held  CBC  Tomorrow morning,   R     Signed and Held          Vitals/Pain Today's Vitals   08/18/19 1546 08/18/19 1842 08/18/19 1845 08/18/19 2109  BP:   124/71 126/75  Pulse:   (!) 118 (!) 117  Resp:   18 20  Temp:      TempSrc:      SpO2:    94% 97%  Weight:      Height:      PainSc: Asleep Asleep      Isolation Precautions No active isolations  Medications Medications  lidocaine (LIDODERM) 5 % 1 patch (1 patch Transdermal Patch Applied 08/18/19 1726)  acetaminophen (TYLENOL) tablet 650 mg (650 mg Oral Given 08/18/19 0432)  oxyCODONE (Oxy IR/ROXICODONE) immediate release tablet 2.5 mg (2.5 mg Oral Given 08/18/19 1457)  cephALEXin (KEFLEX) capsule 500 mg (500 mg Oral Not Given 08/18/19 1310)  iohexol (OMNIPAQUE) 300 MG/ML solution 75 mL (75 mLs Intravenous Contrast Given 08/16/19 1208)  acetaminophen (TYLENOL) tablet 1,000 mg (1,000 mg Oral Given 08/16/19 1353)  oxyCODONE-acetaminophen (PERCOCET/ROXICET) 5-325 MG per tablet 1 tablet (1 tablet Oral Given 08/16/19 1519)  sodium chloride 0.9 % bolus 500 mL (0 mLs Intravenous Stopped 08/18/19 0722)  sodium chloride 0.9 % bolus 500 mL (500 mLs Intravenous New Bag/Given 08/18/19 1856)  0.9 %  sodium chloride infusion ( Intravenous New Bag/Given 08/18/19 1935)  cefTRIAXone (ROCEPHIN) 1 g in sodium chloride 0.9 % 100 mL IVPB (1 g Intravenous New Bag/Given 08/18/19 2024)    Mobility walks with device High fall risk   Focused Assessments Cardiac Assessment Handoff:    No results found for: CKTOTAL, CKMB, CKMBINDEX, TROPONINI No results found for: DDIMER Does the Patient currently have chest pain? No      R Recommendations: See Admitting Provider Note  Report given to:   Additional Notes:  -

## 2019-08-18 NOTE — TOC Initial Note (Addendum)
Transition of Care (TOC) - Initial/Assessment Note    Patient Details  Name: Denise Garrison MRN: [id number] Date of Birth: 1923/01/21  Transition of Care [facility]) CM/SW Contact:    Denise Bouton, LCSW Phone Number: 08/18/2019, 9:14 AM  Clinical Narrative:          The patient is a 85 years old female who presented to the after a fall. The patient lives at [facility] alone.  Her daughter who lives in the area checks on her several times per week.  Patient also has [facility] come in x2/3 per week to help with ADLs.  Prior to fall at baseline patient was able to ambulate with a cane/walker and attend to all her ADLs.  Patient is on room air. Daughter, HPOA, is at ED bedside for this assessment and provided most of the information needed. Daughter would like for this patient to go to a skills nursing facility as recommended by PT eval.         Expected Discharge Plan: [place]  search in progress 0915 began PASARR, FL2 and destination placement search. Barriers to Discharge: SNF Pending bed offer  Patient Goals and CMS Choice Patient states their goals for this hospitalization and ongoing recovery are:: HPOA states that she would like her mother to be discharged to a skills nursing facility, SNF CMS Medicare.gov Compare Post Acute Care list provided to:: Patient Represenative (must comment)(HCPOA) Choice offered to / list presented to : Adult Children(HCPA)  Expected Discharge Plan and Services Expected Discharge Plan: [place] In-house Referral: Clinical Social Work   Post Acute Care Choice: [place] Living arrangements for the past 2 months: [place]                                      Prior Living Arrangements/Services Living arrangements for the past 2 months: [place] Lives with:: Self Patient language and need for interpreter reviewed:: No        Need for Family Participation in  Patient Care: Yes (Comment) Care giver support system in place?: Yes (comment)(Daughters, [facility])   Criminal Activity/Legal Involvement Pertinent to Current Situation/Hospitalization: No - Comment as needed  Activities of Daily Living      Permission Sought/Granted Permission sought to share information with : Case Manager, Customer service manager, Family Supports Permission granted to share information with : Yes, Verbal Permission Granted  Share Information with NAME: Denise Garrison [phone]  Permission granted to share info w AGENCY: Skills Nursing Facilities  Permission granted to share info w Relationship: Family - Daughter Denise Garrison should be the first contact she has the [facility]  [phone]     Emotional Assessment Appearance:: Appears stated age Attitude/Demeanor/Rapport: Lethargic(Lethargic due to medication) Affect (typically observed): Quiet Orientation: : Oriented to Self, Fluctuating Orientation (Suspected and/or reported Sundowners) Alcohol / Substance Use: Not Applicable Psych Involvement: No (comment)  Admission diagnosis:  fall Patient Active Problem List   Diagnosis Date Noted  . Vitamin D deficiency 07/05/2017  . A-fib ([place]) 01/08/2017  . Thyroid fullness 03/09/2014  . Hypercholesterolemia 11/11/2012  . Osteoporosis 11/11/2012  . B12 deficiency 11/11/2012  . Cognitive dysfunction 11/11/2012   PCP:  Einar Pheasant, MD Pharmacy:   [place], [place] - [address] [place] [address] Phone: [phone] Fax: [phone]     Social Determinants of Health ([place])  Interventions    Readmission Risk Interventions No flowsheet data found.

## 2019-08-18 NOTE — ED Notes (Addendum)
Pt on and off bedpan att, no output

## 2019-08-18 NOTE — NC FL2 (Signed)
  [place] LEVEL OF CARE SCREENING TOOL     IDENTIFICATION  Patient Name: Denise Garrison Birthdate: 06-30-1923 Sex: female Admission Date (Current Location): 08/16/2019  [place] and [place] Number:  Engineering geologist and Address:  [facility], [address] [place], [place] [address]      Provider Number: [id number]  Attending Physician Name and Address:  No att. providers found  Relative Name and Phone Number:  Carmon Sails - [phone]    Current Level of Care: SNF Recommended Level of Care: [facility] Prior Approval Number:    Date Approved/Denied:   PASRR Number: [id number]  Discharge Plan: SNF    Current Diagnoses: Patient Active Problem List   Diagnosis Date Noted  . Vitamin D deficiency 07/05/2017  . A-fib ([place]) 01/08/2017  . Thyroid fullness 03/09/2014  . Hypercholesterolemia 11/11/2012  . Osteoporosis 11/11/2012  . B12 deficiency 11/11/2012  . Cognitive dysfunction 11/11/2012    Orientation RESPIRATION BLADDER Height & Weight     Self, Place, Situation  Normal Continent Weight: 120 lb (54.4 kg) Height:  _0  (162.6 cm)  BEHAVIORAL SYMPTOMS/MOOD NEUROLOGICAL BOWEL NUTRITION STATUS  (Calm)   Continent Diet(Heart healthy diet)  AMBULATORY STATUS COMMUNICATION OF NEEDS Skin   Limited Assist Verbally Normal                       Personal Care Assistance Level of Assistance  Bathing, Feeding, Dressing Bathing Assistance: Limited assistance Feeding assistance: Limited assistance Dressing Assistance: Limited assistance     Functional Limitations Info  Sight, Hearing, Speech Sight Info: Adequate Hearing Info: Adequate Speech Info: Adequate    SPECIAL CARE FACTORS FREQUENCY  PT (By licensed PT), OT (By licensed OT)     PT Frequency: 5x OT Frequency: 3x            Contractures Contractures Info: Not present    Additional Factors Info  Allergies, Code Status Code Status Info:  none on file Allergies Info: No known allergies           Current Medications (08/18/2019):  This is the current hospital active medication list Current Facility-Administered Medications  Medication Dose Route Frequency Provider Last Rate Last Dose  . acetaminophen (TYLENOL) tablet 650 mg  650 mg Oral Q4H PRN Duffy Bruce, MD   650 mg at 08/18/19 0432  . cephALEXin (KEFLEX) capsule 500 mg  500 mg Oral Q12H Duffy Bruce, MD   500 mg at 08/17/19 2144  . lidocaine (LIDODERM) 5 % 1 patch  1 patch Transdermal Q24H Duffy Bruce, MD   1 patch at 08/17/19 1423  . oxyCODONE (Oxy IR/ROXICODONE) immediate release tablet 2.5 mg  2.5 mg Oral Q8H PRN Duffy Bruce, MD   2.5 mg at 08/17/19 2355   Current Outpatient Medications  Medication Sig Dispense Refill  . Cyanocobalamin (B-12 COMPLIANCE INJECTION) 1000 MCG/ML KIT Inject 1,000 mcg as directed every 30 (thirty) days.       Discharge Medications: Please see discharge summary for a list of discharge medications.  Relevant Imaging Results:  Relevant Lab Results:   Additional Information SS No. [id number]  Berenice Bouton, LCSW

## 2019-08-18 NOTE — H&P (Signed)
[place] at [place] NAME: Denise Garrison    MR#:  [id number]  DATE OF BIRTH:  [birth year]  DATE OF ADMISSION:  08/16/2019  PRIMARY CARE PHYSICIAN: Einar Pheasant, MD   REQUESTING/REFERRING PHYSICIAN: Quentin Cornwall, MD  CHIEF COMPLAINT:   Chief Complaint  Patient presents with  . Back Pain  . Fall    HISTORY OF PRESENT ILLNESS:  Denise Garrison  is a 85 y.o. female who presents with chief complaint as above.  Patient presents to the emergency department several days ago with a complaint of back pain after a fall.  She was found to have an L1 vertebral compression fracture.  Plan was originally for placement from the emergency department.  However, during her time here in the ED her health has deteriorated some, and on evaluation today she seems significantly dehydrated and is found on urinalysis to have a UTI.  Hospitalist were called for admission  PAST MEDICAL HISTORY:   Past Medical History:  Diagnosis Date  . B12 deficiency   . Cancer [facility])    history of melanoma, s/p excision x 3  . Cavernous hemangioma    History of  . Hiatal hernia    History of  . Hx of detached retina repair   . Hypercholesterolemia   . Osteoporosis      PAST SURGICAL HISTORY:   Past Surgical History:  Procedure Laterality Date  . ABDOMINAL HYSTERECTOMY  1975   Secondary to fibroid tumors  . BREAST BIOPSY     Benign  . CHOLECYSTECTOMY  4/91  . EYE SURGERY     Bilateral Cataract   . SKIN CANCER EXCISION     Malignant Melanoma x3     SOCIAL HISTORY:   Social History   Tobacco Use  . Smoking status: Never Smoker  . Smokeless tobacco: Never Used  Substance Use Topics  . Alcohol use: Yes    Comment: occasional wine, rare     FAMILY HISTORY:   Family History  Problem Relation Age of Onset  . Kidney disease Father   . Heart disease Father   . Kidney disease Brother   . Parkinson's disease Brother   . Melanoma Brother   . Breast  cancer Neg Hx   . Colon cancer Neg Hx      DRUG ALLERGIES:  No Known Allergies  MEDICATIONS AT HOME:   Prior to Admission medications   Medication Sig Start Date End Date Taking? Authorizing Provider  Cyanocobalamin (B-12 COMPLIANCE INJECTION) 1000 MCG/ML KIT Inject 1,000 mcg as directed every 30 (thirty) days.   Yes [provider]    REVIEW OF SYSTEMS:  Review of Systems  Constitutional: Positive for malaise/fatigue. Negative for chills, fever and weight loss.  HENT: Negative for ear pain, hearing loss and tinnitus.   Eyes: Negative for blurred vision, double vision, pain and redness.  Respiratory: Negative for cough, hemoptysis and shortness of breath.   Cardiovascular: Negative for chest pain, palpitations, orthopnea and leg swelling.  Gastrointestinal: Negative for abdominal pain, constipation, diarrhea, nausea and vomiting.  Genitourinary: Negative for dysuria, frequency and hematuria.  Musculoskeletal: Positive for back pain. Negative for joint pain and neck pain.  Skin:       No acne, rash, or lesions  Neurological: Negative for dizziness, tremors, focal weakness and weakness.  Endo/Heme/Allergies: Negative for polydipsia. Does not bruise/bleed easily.  Psychiatric/Behavioral: Negative for depression. The patient is not nervous/anxious and does not have insomnia.  VITAL SIGNS:   Vitals:   08/17/19 2139 08/18/19 0440 08/18/19 1442 08/18/19 1845  BP: 122/75 138/79 128/74 124/71  Pulse: 80 (!) 120 85 (!) 118  Resp: 18 18 16 18   Temp:  97.8 F (36.6 C)    TempSrc:  Oral    SpO2: 95% 95% 93% 94%  Weight:      Height:       Wt Readings from Last 3 Encounters:  08/16/19 54.4 kg  12/20/18 48.9 kg  08/20/18 48.2 kg    PHYSICAL EXAMINATION:  Physical Exam  Vitals reviewed. Constitutional: She is oriented to person, place, and time. She appears well-developed and well-nourished. No distress.  HENT:  Head: Normocephalic and atraumatic.  Mouth/Throat:  Oropharynx is clear and moist.  Eyes: Pupils are equal, round, and reactive to light. Conjunctivae and EOM are normal. No scleral icterus.  Neck: Normal range of motion. Neck supple. No JVD present. No thyromegaly present.  Cardiovascular: Intact distal pulses. Exam reveals no gallop and no friction rub.  No murmur heard. Tachycardic  Respiratory: Effort normal and breath sounds normal. No respiratory distress. She has no wheezes. She has no rales.  GI: Soft. Bowel sounds are normal. She exhibits no distension. There is no abdominal tenderness.  Musculoskeletal: Normal range of motion.        General: Tenderness (Lumbar spine) present. No edema.     Comments: No arthritis, no gout  Lymphadenopathy:    She has no cervical adenopathy.  Neurological: She is alert and oriented to person, place, and time. No cranial nerve deficit.  No dysarthria, no aphasia  Skin: Skin is warm and dry. No rash noted. No erythema.  Psychiatric: She has a normal mood and affect. Her behavior is normal. Judgment and thought content normal.    LABORATORY PANEL:   CBC Recent Labs  Lab 08/16/19 1111  WBC 8.2  HGB 13.2  HCT 42.2  PLT 245   ------------------------------------------------------------------------------------------------------------------  Chemistries  Recent Labs  Lab 08/16/19 1111  NA 145  K 4.0  CL 108  CO2 28  GLUCOSE 101*  BUN 14  CREATININE 0.57  CALCIUM 8.7*  AST 16  ALT 12  ALKPHOS 59  BILITOT 0.7   ------------------------------------------------------------------------------------------------------------------  Cardiac Enzymes No results for input(s): TROPONINI in the last 168 hours. ------------------------------------------------------------------------------------------------------------------  RADIOLOGY:  No results found.  EKG:   Orders placed or performed in visit on 01/06/17  . EKG 12-Lead    IMPRESSION AND PLAN:  Principal Problem:   UTI (urinary  tract infection) -IV antibiotics, urine culture ordered, IV fluids for hydration Active Problems:   Bilateral renal masses -urology consult to guide further evaluation   Closed compression fracture of L1 vertebra [facility]) -orthopedic surgery consult to guide further treatment   A-fib ([facility]) -does not seem to be on medications for this, will treat as needed if her heart rate exceeds 110  Chart review performed and case discussed with ED provider. Labs, imaging and/or ECG reviewed by provider and discussed with patient/family. Management plans discussed with the patient and/or family.  COVID-19 status: Pending  DVT PROPHYLAXIS: SubQ lovenox   GI PROPHYLAXIS:  None  ADMISSION STATUS: Inpatient     CODE STATUS: Full  TOTAL TIME TAKING CARE OF THIS PATIENT: 45 minutes.   This patient was evaluated in the context of the global COVID-19 pandemic, which necessitated consideration that the patient might be at risk for infection with the SARS-CoV-2 virus that causes COVID-19. Institutional protocols and algorithms that pertain to  the evaluation of patients at risk for COVID-19 are in a state of rapid change based on information released by regulatory bodies including the CDC and federal and state organizations. These policies and algorithms were followed to the best of this provider's knowledge to date during the patient's care at this facility.  Ethlyn Daniels 08/18/2019, 8:29 PM  [facility]  Office  [phone]  CC: Primary care physician; Einar Pheasant, MD  Note:  This document was prepared using Dragon voice recognition software and may include unintentional dictation errors.

## 2019-08-19 LAB — BASIC METABOLIC PANEL
Anion gap: 10 (ref 5–15)
BUN: 11 mg/dL (ref 8–23)
CO2: 23 mmol/L (ref 22–32)
Calcium: 7.9 mg/dL — ABNORMAL LOW (ref 8.9–10.3)
Chloride: 109 mmol/L (ref 98–111)
Creatinine, Ser: 0.53 mg/dL (ref 0.44–1.00)
GFR calc Af Amer: >60 mL/min (ref >60)
GFR calc non Af Amer: >60 mL/min (ref >60)
Glucose, Bld: 80 mg/dL (ref 70–99)
Potassium: 3.2 mmol/L — ABNORMAL LOW (ref 3.5–5.1)
Sodium: 142 mmol/L (ref 135–145)

## 2019-08-19 LAB — CBC
HCT: 42.4 % (ref 36.0–46.0)
Hemoglobin: 13.2 g/dL (ref 12.0–15.0)
MCH: 28 pg (ref 26.0–34.0)
MCHC: 31.1 g/dL (ref 30.0–36.0)
MCV: 89.8 fL (ref 80.0–100.0)
Platelets: 232 10*3/uL (ref 150–400)
RBC: 4.72 MIL/uL (ref 3.87–5.11)
RDW: 14.6 % (ref 11.5–15.5)
WBC: 6.3 10*3/uL (ref 4.0–10.5)
nRBC: 0 % (ref 0.0–0.2)

## 2019-08-19 LAB — SARS CORONAVIRUS 2 (TAT 6-24 HRS): SARS Coronavirus 2: NEGATIVE

## 2019-08-19 MED ORDER — POTASSIUM CHLORIDE CRYS ER 20 MEQ PO TBCR
40.0000 meq | EXTENDED_RELEASE_TABLET | ORAL | Status: AC
  Administered 2019-08-19: 40 meq via ORAL
  Filled 2019-08-19: qty 2

## 2019-08-19 MED ORDER — ENOXAPARIN SODIUM 60 MG/0.6ML ~~LOC~~ SOLN
55.0000 mg | Freq: Two times a day (BID) | SUBCUTANEOUS | Status: DC
  Administered 2019-08-19 – 2019-08-21 (×5): 55 mg via SUBCUTANEOUS
  Filled 2019-08-19 (×6): qty 0.6

## 2019-08-19 MED ORDER — DILTIAZEM HCL 25 MG/5ML IV SOLN
5.0000 mg | Freq: Once | INTRAVENOUS | Status: AC
  Administered 2019-08-19: 5 mg via INTRAVENOUS
  Filled 2019-08-19: qty 5

## 2019-08-19 MED ORDER — DILTIAZEM HCL 25 MG/5ML IV SOLN
10.0000 mg | Freq: Once | INTRAVENOUS | Status: AC
  Administered 2019-08-19: 01:00:00 10 mg via INTRAVENOUS
  Filled 2019-08-19: qty 5

## 2019-08-19 NOTE — [place] Notes (Signed)
[place] at [place] NAME: Denise Garrison    MR#:  [id number]  DATE OF BIRTH:  Feb 05, 1923  SUBJECTIVE: 85 year old female is admitted for UTI but found to have fracture, left renal cell carcinoma, patient told me that she never had diagnosis of cancer, denies any back pain, abdominal pain.  CHIEF COMPLAINT:   Chief Complaint  Patient presents with  . Back Pain  . Fall   Alert, awake, oriented, denies any complaints. REVIEW OF SYSTEMS:   ROS CONSTITUTIONAL: No fever, fatigue or weakness.  EYES: No blurred or double vision.  EARS, NOSE, AND THROAT: No tinnitus or ear pain.  RESPIRATORY: No cough, shortness of breath, wheezing or hemoptysis.  CARDIOVASCULAR: No chest pain, orthopnea, edema.  GASTROINTESTINAL: No nausea, vomiting, diarrhea or abdominal pain.  GENITOURINARY: No dysuria, hematuria.  ENDOCRINE: No polyuria, nocturia,  HEMATOLOGY: No anemia, easy bruising or bleeding SKIN: No rash or lesion. MUSCULOSKELETAL: No joint pain or arthritis.   NEUROLOGIC: No tingling, numbness, weakness.  PSYCHIATRY: No anxiety or depression.   DRUG ALLERGIES:  No Known Allergies  VITALS:  Blood pressure 136/72, pulse 93, temperature 97.9 F (36.6 C), temperature source Oral, resp. rate 18, height 5\' 4"  (1.626 m), weight 54.4 kg, SpO2 95 %.  PHYSICAL EXAMINATION:  GENERAL:  85 y.o.-year-old patient lying in the bed with no acute distress.  EYES: Pupils equal, round, reactive to light and accommodation. No scleral icterus. Extraocular muscles intact.  HEENT: Head atraumatic, normocephalic. Oropharynx and nasopharynx clear.  NECK:  Supple, no jugular venous distention. No thyroid enlargement, no tenderness.  LUNGS: Normal breath sounds bilaterally, no wheezing, rales,rhonchi or crepitation. No use of accessory muscles of respiration.  CARDIOVASCULAR: S1, S2 normal. No murmurs, rubs, or gallops.  ABDOMEN: Soft, nontender, nondistended.  Bowel sounds present. No organomegaly or mass.  EXTREMITIES: No pedal edema, cyanosis, or clubbing.  NEUROLOGIC: Cranial nerves II through XII are intact. Muscle strength 5/5 in all extremities. Sensation intact. Gait not checked.  PSYCHIATRIC: The patient is alert and oriented x 3.  SKIN: No obvious rash, lesion, or ulcer.    LABORATORY PANEL:   CBC Recent Labs  Lab 08/19/19 0606  WBC 6.3  HGB 13.2  HCT 42.4  PLT 232   ------------------------------------------------------------------------------------------------------------------  Chemistries  Recent Labs  Lab 08/16/19 1111 08/19/19 0606  NA 145 142  K 4.0 3.2*  CL 108 109  CO2 28 23  GLUCOSE 101* 80  BUN 14 11  CREATININE 0.57 0.53  CALCIUM 8.7* 7.9*  AST 16  --   ALT 12  --   ALKPHOS 59  --   BILITOT 0.7  --    ------------------------------------------------------------------------------------------------------------------  Cardiac Enzymes No results for input(s): TROPONINI in the last 168 hours. ------------------------------------------------------------------------------------------------------------------  RADIOLOGY:  No results found.  EKG:   Orders placed or performed in visit on 01/06/17  . EKG 12-Lead    ASSESSMENT AND PLAN:   85 year old female patient history of, hyperlipidemia, osteoporosis.  Yesterday because of back pain, fall.  #1/mild UTI, patient is on IV antibiotics, 2.  Back pain, patient had L1 compression fracture, patient denies any back pain on my exam, no numbness in hands or legs.  Orthopedic consult placed because of L1 compression fracture.  Patient is on Lidoderm patch 3.  Chronic atrial fibrillation, patient followed by PCP, not on any medication, use PRN Cardizem.  As per PCP note patient refused anticoagulation.  Patient received IV push of Cardizem in the hospital.  4.  Newly diagnosed left renal cell cancer, patient has no symptoms, urology consult placed for further  work-up.  Urologist is contacted by epic.    5.  Hypokalemia due to poor p.o. intake, replace potassium.  All the records are reviewed and case discussed with Care Management/Social Workerr. Management plans discussed with the patient, family and they are in agreement.  CODE STATUS:full  total time taking care of this patient; 35 minutes  More than 50% time spent in counseling, coordination of care POSSIBLE D/C IN 2 to 3 days DAYS, DEPENDING ON CLINICAL CONDITION.   Epifanio Lesches M.D on 08/19/2019 at 10:28 AM  Between 7am to 6pm - Pager - [phone]  After 6pm go to [email or web address] - password [email or web address] [facility]  Office  [phone]  CC: Primary care physician; Einar Pheasant, MD   Note: This dictation was prepared with Dragon dictation along with smaller phrase technology. Any transcriptional errors that result from this process are unintentional.

## 2019-08-19 NOTE — Progress Notes (Signed)
Physical Therapy Treatment Patient Details Name: Denise Garrison. [name] MRN: [id number] DOB: February 02, 1923 Today's Date: 08/19/2019    History of Present Illness Denise Garrison is a 35yoF who comes to [facility] after a fall at home, found to have acute L1 fracture. Pt issued TLSO, CSW searching for bed placement, as pt lived alone PTA adn now is having difficulty AMB d/t back pain.    PT Comments    Pt's HR fluctuating between 97-122 bpm at rest in bed; nurse cleared PT to transfer pt to bedside chair.  Pt requiring assist to donn TLSO and also for bed mobility and transfer to chair.  Pt's HR increased to 145 bpm with transfer to chair but HR decreased back to mid 90's to around 121 bpm at rest in chair after a few minutes of sitting rest break; nurse notified of pt's increased HR with activity.  Deferred further mobility d/t significant increased HR with minimal activity.  Will continue to focus on strengthening and progressive functional mobility per pt tolerance.    Follow Up Recommendations  SNF     Equipment Recommendations  Other (comment)(youth RW)    Recommendations for Other Services       Precautions / Restrictions Precautions Precautions: Fall Precaution Comments: presumptive back precautions, as TLSO resists trunk flexion, pt will be asked to avoid trunk flexion when in bed and TLSO is doffed. Required Braces or Orthoses: Spinal Brace Spinal Brace: Thoracolumbosacral orthotic Restrictions Weight Bearing Restrictions: No    Mobility  Bed Mobility Overal bed mobility: Needs Assistance Bed Mobility: Rolling;Sidelying to Sit Rolling: Min assist(vc's for technique and use of bed rail) Sidelying to sit: Mod assist       General bed mobility comments: assist for trunk and to maintain spinal precautions; vc's for logrolling technique  Transfers Overall transfer level: Needs assistance Equipment used: None Transfers: Sit to/from [facility] Sit to Stand: Min  assist Stand pivot transfers: Min assist       General transfer comment: min assist to stand from bed and to take steps bed to recliner with B UE support on therapists arms  Ambulation/Gait             General Gait Details: deferred d/t elevated HR with activity   Stairs             Wheelchair Mobility    Modified Rankin (Stroke Patients Only)       Balance Overall balance assessment: Needs assistance Sitting-balance support: No upper extremity supported;Feet supported Sitting balance-Leahy Scale: Fair Sitting balance - Comments: steady static sitting   Standing balance support: Single extremity supported Standing balance-Leahy Scale: Poor Standing balance comment: pt requiring at least single UE support for static standing balance                            Cognition Arousal/Alertness: Awake/alert Behavior During Therapy: WFL for tasks assessed/performed Overall Cognitive Status: No family/caregiver present to determine baseline cognitive functioning                                 General Comments: Oriented to person; forgetful regarding L1 compression fx      Exercises      General Comments   Nursing cleared pt for participation in physical therapy.  Pt agreeable to PT session.      Pertinent Vitals/Pain Pain Assessment: Faces Pain Location: low back Pain  Descriptors / Indicators: Sore Pain Intervention(s): Limited activity within patient's tolerance;Monitored during session;Repositioned    Home Living                      Prior Function            PT Goals (current goals can now be found in the care plan section) Acute Rehab PT Goals Patient Stated Goal: return to independent mobility. PT Goal Formulation: With family Time For Goal Achievement: 08/31/19 Potential to Achieve Goals: Good Progress towards PT goals: Progressing toward goals    Frequency    7X/week      PT Plan Current plan  remains appropriate    Co-evaluation              AM-PAC PT "6 Clicks" Mobility   Outcome Measure  Help needed turning from your back to your side while in a flat bed without using bedrails?: A Little Help needed moving from lying on your back to sitting on the side of a flat bed without using bedrails?: A Lot Help needed moving to and from a bed to a chair (including a wheelchair)?: A Little Help needed standing up from a chair using your arms (e.g., wheelchair or bedside chair)?: A Little Help needed to walk in hospital room?: A Little Help needed climbing 3-5 steps with a railing? : A Lot 6 Click Score: 16    End of Session Equipment Utilized During Treatment: Gait belt;Back brace Activity Tolerance: Treatment limited secondary to medical complications (Comment)(HR elevated with transfer to chair) Patient left: in chair;with call bell/phone within reach;with chair alarm set Nurse Communication: Mobility status;Precautions;Other (comment)(Pt's elevated HR with activity) PT Visit Diagnosis: Other abnormalities of gait and mobility (R26.89);Difficulty in walking, not elsewhere classified (R26.2);Other symptoms and signs involving the nervous system (R29.898);Muscle weakness (generalized) (M62.81)     Time: [id number] PT Time Calculation (min) (ACUTE ONLY): 27 min  Charges:  $Therapeutic Activity: 23-37 mins                     Leitha Bleak, PT 08/19/19, 10:40 AM [phone]

## 2019-08-19 NOTE — Progress Notes (Signed)
Ch visited pt to complete AD education. Pt was alert and sitting upright in the bed and wanted to complete her breakfast. Ch allowed time for the pt to finish her meal. Pt shared that her daughter [name]) would be visiting her soon. Ch contacted the pt daughter via telephone to determine if the pt had an HPOA in place. As reported by the daughter, the pt has documentation in place and that she could provide a copy when she came to visit the pt. The daughter also shared that the pt is DNR. Ch shared that this also needed to be updated to the pt's chart. Daughter understood and was instructed to provide the documentation to the pt's nurse during her time visiting w/ the pt.   Goal: Complete GOC for pt; f/u with pt and daughter to provide spiritual support and clarity on the HPOA documentation     08/19/19 1100  Clinical Encounter Type  Visited With Patient;Family  Visit Type Other (Comment) (AD education )  Referral From Physician  Consult/Referral To Chaplain  Stress Factors  Patient Stress Factors Health changes  Family Stress Factors None identified  Advance Directives (For Healthcare)  Does Patient Have a Medical Advance Directive? Yes  Does patient want to make changes to medical advance directive? No - Guardian declined  Type of Paramedic of [place];Living will  Copy of [place] in Chart? No - copy requested  Copy of Living Will in Chart? No - copy requested

## 2019-08-19 NOTE — Progress Notes (Signed)
Spoke with patient's daughter Ms. [name]  at [phone], explained that patient may have renal cell malignancy and also waiting for urology input, according to her she fell forward at home, now has L1 compression fracture she was in severe back pain yesterday but today she is better, continue Lidoderm patch, waiting for orthopedic input, according to her she has chronic atrial fibrillation and not on any anticoagulation and and also not on any rate controlling medication.

## 2019-08-19 NOTE — Progress Notes (Signed)
This a.m. patients heart rate was increasing again from 95-121 and showing A-fib. Messaged MD and he put in orders for Diltiazem 5mg  IV once, changed Lovenox dose, labs. Will continue to monitor.

## 2019-08-19 NOTE — Progress Notes (Signed)
ANTICOAGULATION CONSULT NOTE - Initial Consult  Pharmacy Consult for Lovenox Indication: atrial fibrillation  No Known Allergies  Patient Measurements: Height: 5\' 4"  (162.6 cm) Weight: 120 lb (54.4 kg) IBW/kg (Calculated) : 54.7  Vital Signs: Temp: 97.8 F (36.6 C) (09/07 0422) Temp Source: Oral (09/07 0422) BP: 142/71 (09/07 0422) Pulse Rate: 88 (09/07 0422)  Labs: Recent Labs    08/16/19 1111  HGB 13.2  HCT 42.2  PLT 245  CREATININE 0.57   Estimated Creatinine Clearance: 36.9 mL/min (by C-G formula based on SCr of 0.57 mg/dL).  Medical History: Past Medical History:  Diagnosis Date  . B12 deficiency   . Cancer [facility])    history of melanoma, s/p excision x 3  . Cavernous hemangioma    History of  . Hiatal hernia    History of  . Hx of detached retina repair   . Hypercholesterolemia   . Osteoporosis    Medications:  Scheduled:  . enoxaparin (LOVENOX) injection  55 mg Subcutaneous Q12H  . lidocaine  1 patch Transdermal Q24H   Assessment: Pharmacy asked to initiate Lovenox for atrial fibrillation.    Goal of Therapy:  Anti-Xa level 0.6-1 units/ml 4hrs after LMWH dose given Monitor platelets by anticoagulation protocol: Yes   Plan:  Lovenox 1mg /Kg SQ q12hrs  Hart Robinsons A 08/19/2019,6:08 AM

## 2019-08-19 NOTE — Progress Notes (Signed)
Patient has not urinated since she has came to our unit. She states that she does not have to. Bladder scanned patient and she has 500cc around 0500 this a.m. Messaged MD about same and he suggested we let her bladder fill up some more and go from there. Will continue to monitor.

## 2019-08-19 NOTE — Progress Notes (Signed)
Patients heart rate was getting up to 140s. Telemetry called and stated that patient was in Afib with rvr. Messaged MD and gave recent BP, new order for Diltiazem 10 IV once. Will continue to monitor.

## 2019-08-19 NOTE — Consult Note (Signed)
ORTHOPAEDIC CONSULTATION  REQUESTING PHYSICIAN: Epifanio Lesches, MD  Chief Complaint:   Lower back pain.  History of Present Illness: Denise Garrison is a 85 y.o. pleasantly demented female with a history of B12 deficiency, melanoma, hypercholesterolemia, and osteoporosis who lives in an independent living facility.  Apparently she lost her balance and fell 3 days ago.  She was brought to the emergency room complaining of lower back pain.  X-rays in the emergency room demonstrated an L1 compression fracture which was confirmed on CT scan.  Efforts were made to place her directly into a skilled nursing facility from the emergency room so the patient was held in the ER for 2 days.  In addition, the patient was fitted with a Jewett type back extension brace while in the emergency room.  Apparently, she developed a UTI while in the emergency room and so was admitted last night for treatment of this, as well as to continue efforts for skilled nursing facility placement.    Today, the patient denies any pain in her back.  She is sitting up and eating in her brace, and appears quite comfortable.  She denies any numbness or paresthesias to either lower extremity or foot.  Past Medical History:  Diagnosis Date  . B12 deficiency   . Cancer [facility])    history of melanoma, s/p excision x 3  . Cavernous hemangioma    History of  . Hiatal hernia    History of  . Hx of detached retina repair   . Hypercholesterolemia   . Osteoporosis    Past Surgical History:  Procedure Laterality Date  . ABDOMINAL HYSTERECTOMY  1975   Secondary to fibroid tumors  . BREAST BIOPSY     Benign  . CHOLECYSTECTOMY  4/91  . EYE SURGERY     Bilateral Cataract   . SKIN CANCER EXCISION     Malignant Melanoma x3   Social History   Socioeconomic History  . Marital status: Widowed    Spouse name: Not on file  . Number of children: 2  . Years of  education: Not on file  . Highest education level: Not on file  Occupational History  . Not on file  Social Needs  . Financial resource strain: Patient refused  . Food insecurity    Worry: Patient refused    Inability: Patient refused  . Transportation needs    Medical: Patient refused    Non-medical: Patient refused  Tobacco Use  . Smoking status: Never Smoker  . Smokeless tobacco: Never Used  Substance and Sexual Activity  . Alcohol use: Yes    Comment: occasional wine, rare  . Drug use: No  . Sexual activity: Not Currently  Lifestyle  . Physical activity    Days per week: Patient refused    Minutes per session: Patient refused  . Stress: Patient refused  Relationships  . Social Herbalist on phone: Patient refused    Gets together: Patient refused    Attends religious service: Patient refused    Active member of club or organization: Patient refused    Attends meetings of clubs or organizations: Patient refused    Relationship status: Patient refused  Other Topics Concern  . Not on file  Social History Narrative   She is a widow ,has two children one son and one daughter.  She resided at [place] History  Problem Relation Age of Onset  . Kidney disease Father   . Heart disease  Father   . Kidney disease Brother   . Parkinson's disease Brother   . Melanoma Brother   . Breast cancer Neg Hx   . Colon cancer Neg Hx    No Known Allergies Prior to Admission medications   Medication Sig Start Date End Date Taking? Authorizing Provider  Cyanocobalamin (B-12 COMPLIANCE INJECTION) 1000 MCG/ML KIT Inject 1,000 mcg as directed every 30 (thirty) days.   Yes [provider]   No results found.  Positive ROS: All other systems have been reviewed and were otherwise negative with the exception of those mentioned in the HPI and as above.  Physical Exam: General:  Alert, no acute distress Psychiatric:  Patient is not competent for consent, but  exhibits normal mood and affect   Cardiovascular:  No pedal edema Respiratory:  No wheezing, non-labored breathing GI:  Abdomen is soft and non-tender Skin:  No lesions in the area of chief complaint Neurologic:  Sensation intact distally Lymphatic:  No axillary or cervical lymphadenopathy  Orthopedic Exam:  Orthopedic examination is limited to her mid and lower back, as well as both lower extremities.  As mentioned above, she is sitting up in her chair eating lunch in her brace, and denies any discomfort in this position.  Skin inspection of her back is unremarkable.  No swelling, erythema, ecchymosis, abrasions, or other skin abnormalities are identified.  She has no tenderness to percussion along the lower thoracic or lumbar spine, nor is there any discomfort across her sacrum.  She is able to dorsiflex and plantarflex her toes and ankles bilaterally.  Sensation is intact to light touch in all distributions bilaterally.  She has good capillary refill to both feet.  X-rays:  X-rays of the lumbar spine, as well as a CT scan of the abdomen and pelvis are available for review and have been reviewed by myself.  These studies both demonstrate what appears to be an acute mildly impacted L1 compression fracture.  There is mild retropulsion but no obvious canal compromise.  No other acute bony processes are identified.  Assessment: Acute L1 compression fracture.  Plan: The treatment options have been discussed with the patient.  At this point, the patient appears to be quite comfortable sitting up in her brace.  Therefore, I do not feel that any further orthopedic intervention is necessary at this time.  If she does develop worsening pain in her back with ambulation or with sitting upright, then we may consider asking Dr. Rudene Christians to assess her for a possible kyphoplasty.  Thank you for asked me to participate in the care of this most pleasant woman.  I will reassess her tomorrow.   Pascal Lux,  MD  Beeper #:  [phone]  08/19/2019 1:32 PM

## 2019-08-19 NOTE — TOC Progression Note (Signed)
Transition of Care (TOC) - Progression Note    Patient Details  Name: Denise Garrison MRN: [id number] Date of Birth: 1922/12/29  Transition of Care [facility]) CM/SW Contact  Aiyla Baucom, Lenice Llamas Phone Number: [phone]  08/19/2019, 2:23 PM  Clinical Narrative: Patient's daughter [name] requested [facility]. Tracie admissions coordinator at [facility] said she would need to know if patient is seeking cancer treatment. Per [name] patient is not on cancer treatment. CSW explained that [facility] make a bed offer for patient. Daughter will accept bed offer from [facility] if [facility] can't make an offer. Leana Roe is aware of above. CSW will continue to follow and assist as needed.     Expected Discharge Plan: Skilled Nursing Facility Barriers to Discharge: SNF Pending bed offer  Expected Discharge Plan and Services Expected Discharge Plan: [place] In-house Referral: Clinical Social Work   Post Acute Care Choice: [place] Living arrangements for the past 2 months: [place]                                       Social Determinants of Health (SDOH) Interventions    Readmission Risk Interventions No flowsheet data found.

## 2019-08-20 LAB — BASIC METABOLIC PANEL
Anion gap: 9 (ref 5–15)
BUN: 12 mg/dL (ref 8–23)
CO2: 26 mmol/L (ref 22–32)
Calcium: 8.3 mg/dL — ABNORMAL LOW (ref 8.9–10.3)
Chloride: 109 mmol/L (ref 98–111)
Creatinine, Ser: 0.48 mg/dL (ref 0.44–1.00)
GFR calc Af Amer: >60 mL/min (ref >60)
GFR calc non Af Amer: >60 mL/min (ref >60)
Glucose, Bld: 124 mg/dL — ABNORMAL HIGH (ref 70–99)
Potassium: 3.7 mmol/L (ref 3.5–5.1)
Sodium: 144 mmol/L (ref 135–145)

## 2019-08-20 LAB — URINE CULTURE: Culture: <10000 — AB

## 2019-08-20 LAB — MAGNESIUM: Magnesium: 2 mg/dL (ref 1.7–2.4)

## 2019-08-20 MED ORDER — CALCIUM GLUCONATE-NACL 1-0.675 GM/50ML-% IV SOLN
1.0000 g | Freq: Once | INTRAVENOUS | Status: AC
  Administered 2019-08-20: 22:00:00 1000 mg via INTRAVENOUS
  Filled 2019-08-20: qty 50

## 2019-08-20 MED ORDER — MAGNESIUM SULFATE 2 GM/50ML IV SOLN
2.0000 g | Freq: Once | INTRAVENOUS | Status: AC
  Administered 2019-08-20: 2 g via INTRAVENOUS
  Filled 2019-08-20: qty 50

## 2019-08-20 MED ORDER — KETOROLAC TROMETHAMINE 15 MG/ML IJ SOLN
15.0000 mg | Freq: Three times a day (TID) | INTRAMUSCULAR | Status: DC | PRN
  Administered 2019-08-20: 15 mg via INTRAVENOUS
  Filled 2019-08-20 (×2): qty 1

## 2019-08-20 MED ORDER — SODIUM CHLORIDE 0.9 % IV SOLN
INTRAVENOUS | Status: AC
  Administered 2019-08-20: 21:00:00 via INTRAVENOUS

## 2019-08-20 MED ORDER — DILTIAZEM HCL 30 MG PO TABS
60.0000 mg | ORAL_TABLET | Freq: Three times a day (TID) | ORAL | Status: DC
  Administered 2019-08-20 (×2): 60 mg via ORAL
  Filled 2019-08-20 (×2): qty 2

## 2019-08-20 MED ORDER — DOCUSATE SODIUM 100 MG PO CAPS
100.0000 mg | ORAL_CAPSULE | Freq: Two times a day (BID) | ORAL | Status: DC
  Administered 2019-08-20 – 2019-08-21 (×3): 100 mg via ORAL
  Filled 2019-08-20 (×3): qty 1

## 2019-08-20 NOTE — Progress Notes (Signed)
Advanced care plan.  Purpose of the Encounter: CODE STATUS  Parties in Attendance: Patient's daughter over the phone  Patient's Decision Capacity: Not intact  Subjective/Patient's story: Patient is 85 year old female complaint of fall back pain.  Has a history of dementia, hypercholesterolemia, history of B12 deficiency and melanoma Who was admitted with possible renal cell cancer and L1 compression fracture.  I discussed with the patient's daughter regarding overall poor prognosis and asked her about patient's wishes regarding CODE STATUS  Objective/Medical story Patient's daughter states that her mother is a DNR   Goals of care determination:  I will change patient's CODE STATUS to DNR   CODE STATUS: dnr   Time spent discussing advanced care planning: 16 minutes

## 2019-08-20 NOTE — Consult Note (Signed)
Urology Consult  I have been asked to see the patient by Dr. Posey Pronto, for evaluation and management of incidental renal cell carcinoma(s).  Chief Complaint: Bilateral renal lesions  History of Present Illness: Denise Garrison is a 85 y.o. year old female admitted on 08/16/2019 following an unwitnessed fall in her independent living facility. She was found to have a new L1 compression fracture. CT abdomen pelvis with contrast revealed incidental bilateral renal lesions, including the following:  Left interpolar 1.3cm likely RCC  Left interpolar 0.9cm lesion with lateral complexity  Left upper polar lesion too small to characterize  Right central 2.0x2.1cm heterogeneous mass including solid components, likely RCC  Per chart review, abdominal US on 09/02/2019 revealed solid hyperechoic lesions of the left kidney, the largest measuring 1.5cm. Radiology recommended follow-up with triphasic CT or MRI. Per PCP note by Dr. Einar Pheasant on 11/11/2012, patient repeatedly declined further evaluation of these.  Creatinine stable at 0.48. Patient reports abdominal and back pain secondary to her recent injury; she denies flank discomfort at baseline.  Past Medical History:  Diagnosis Date  . B12 deficiency   . Cancer [facility])    history of melanoma, s/p excision x 3  . Cavernous hemangioma    History of  . Hiatal hernia    History of  . Hx of detached retina repair   . Hypercholesterolemia   . Osteoporosis     Past Surgical History:  Procedure Laterality Date  . ABDOMINAL HYSTERECTOMY  1975   Secondary to fibroid tumors  . BREAST BIOPSY     Benign  . CHOLECYSTECTOMY  4/91  . EYE SURGERY     Bilateral Cataract   . SKIN CANCER EXCISION     Malignant Melanoma x3    Home Medications:  Current Meds  Medication Sig  . Cyanocobalamin (B-12 COMPLIANCE INJECTION) 1000 MCG/ML KIT Inject 1,000 mcg as directed every 30 (thirty) days.    Allergies: No Known Allergies  Family History   Problem Relation Age of Onset  . Kidney disease Father   . Heart disease Father   . Kidney disease Brother   . Parkinson's disease Brother   . Melanoma Brother   . Breast cancer Neg Hx   . Colon cancer Neg Hx     Social History:  reports that she has never smoked. She has never used smokeless tobacco. She reports current alcohol use. She reports that she does not use drugs.  ROS: A complete review of systems was performed.  All systems are negative except for pertinent findings as noted.  Physical Exam:  Vital signs in last 24 hours: Temp:  [97.5 F (36.4 C)-98.4 F (36.9 C)] 98.3 F (36.8 C) (09/08 1018) Pulse Rate:  [71-76] 73 (09/08 0426) Resp:  [18] 18 (09/08 1018) BP: (133-151)/(57-70) 144/57 (09/08 1018) SpO2:  [94 %-97 %] 94 % (09/08 1018) Constitutional:  Alert and awake, no acute distress HEENT: [place] AT, moist mucus membranes Cardiovascular: No clubbing, cyanosis, or edema Respiratory: Normal respiratory effort Skin: No rashes, bruises or suspicious lesions Neurologic: Grossly intact, no focal deficits, moving all 4 extremities, ambulatory with standby assist MSK: back brace in place Psychiatric: Normal mood and affect  Laboratory Data:  Recent Labs    08/19/19 0606  WBC 6.3  HGB 13.2  HCT 42.4   Recent Labs    08/19/19 0606 08/20/19 0523  NA 142 144  K 3.2* 3.7  CL 109 109  CO2 23 26  GLUCOSE 80 124*  BUN 11 12  CREATININE 0.53 0.48  CALCIUM 7.9* 8.3*   Urinalysis    Component Value Date/Time   COLORURINE YELLOW (A) 08/16/2019 1538   APPEARANCEUR HAZY (A) 08/16/2019 1538   LABSPEC >1.046 (H) 08/16/2019 1538   PHURINE 5.0 08/16/2019 1538   GLUCOSEU NEGATIVE 08/16/2019 1538   HGBUR NEGATIVE 08/16/2019 1538   BILIRUBINUR NEGATIVE 08/16/2019 1538   BILIRUBINUR neg 09/08/2014 1030   KETONESUR 5 (A) 08/16/2019 1538   PROTEINUR NEGATIVE 08/16/2019 1538   UROBILINOGEN negative 09/08/2014 1030   NITRITE NEGATIVE 08/16/2019 1538   LEUKOCYTESUR  MODERATE (A) 08/16/2019 1538   Results for orders placed or performed during the hospital encounter of 08/16/19  SARS CORONAVIRUS 2 (TAT 6-24 HRS) Nasopharyngeal Nasopharyngeal Swab     Status: None   Collection Time: 08/18/19  8:09 PM   Specimen: Nasopharyngeal Swab  Result Value Ref Range Status   SARS Coronavirus 2 NEGATIVE NEGATIVE Final    Comment: (NOTE) SARS-CoV-2 target nucleic acids are NOT DETECTED. The SARS-CoV-2 RNA is generally detectable in upper and lower respiratory specimens during the acute phase of infection. Negative results do not preclude SARS-CoV-2 infection, do not rule out co-infections with other pathogens, and should not be used as the sole basis for treatment or other patient management decisions. Negative results must be combined with clinical observations, patient history, and epidemiological information. The expected result is Negative. Fact Sheet for Patients: SugarRoll.be Fact Sheet for Healthcare Providers: https://www.woods-mathews.com/ This test is not yet approved or cleared by the [place] FDA and  has been authorized for detection and/or diagnosis of SARS-CoV-2 by FDA under an Emergency Use Authorization (EUA). This EUA will remain  in effect (meaning this test can be used) for the duration of the COVID-19 declaration under Section 56 4(b)(1) of the Act, 21 U.S.C. section 360bbb-3(b)(1), unless the authorization is terminated or revoked sooner. Performed at [facility], [place] [address] [place], [place] [address]     Radiologic Imaging: CT ABDOMEN PELVIS W CONTRAST 08/16/2019: CLINICAL DATA:  Abdominal pain. Unwitnessed fall from bed. Hysterectomy and cholecystectomy. History of melanoma.  EXAM: CT ABDOMEN AND PELVIS WITH CONTRAST  TECHNIQUE: Multidetector CT imaging of the abdomen and pelvis was performed using the standard protocol following bolus administration of intravenous  contrast.  CONTRAST:  42m OMNIPAQUE IOHEXOL 300 MG/ML  SOLN  COMPARISON:  09/01/2009 abdominal ultrasound.  No comparison CT.  FINDINGS: Lower chest: Bibasilar scarring. Right middle lobe dominant bulla or bleb. Minimal motion degradation. Mild cardiomegaly with small left and trace right pleural effusions.  Hepatobiliary: High right hepatic lobe hemangioma at 6.4 x 4.0 cm on 22/2 left hepatic lobe cysts. Cholecystectomy, without biliary ductal dilatation.  Pancreas: No pancreatic duct dilatation or acute inflammation. Suspected cystic lesion in the pancreatic tail of 3 mm on 32/2 is of doubtful clinical significance given patient age.  Spleen: Low-density subcentimeter splenic lesion is also of no clinical significance.  Adrenals/Urinary Tract: Mild left adrenal thickening. Normal right adrenal gland.  1.3 cm interpolar left renal lesion demonstrates postcontrast characteristics which are suspicious for renal cell carcinoma, including on 33/2.  A more posterior interpolar left renal 9 mm lesion on 34/2 demonstrates complexity along its lateral portion.  Upper pole left renal too small to characterize lesion.  Central right interpolar heterogeneous right renal mass measures 2.1 x 2.0 cm on 44/2 and 35/5. Also demonstrates "deenhancement" on delayed images, consistent with a solid mass. There also bilateral renal sinus cysts. No hydronephrosis. Normal urinary  bladder.  Stomach/Bowel: Normal stomach, without wall thickening. Extensive colonic diverticulosis. Normal terminal ileum. Normal small bowel.  Vascular/Lymphatic: Advanced aortic and branch vessel atherosclerosis. Patent renal veins. No abdominopelvic adenopathy.  Reproductive: Hysterectomy.  No adnexal mass.  Other: No significant free fluid.  Moderate pelvic floor laxity.  Musculoskeletal: Osteopenia. Remote lower posterior left rib fractures. Acute mild to moderate L1 compression deformity  with mild ventral canal encroachment, including on sagittal image 63.  IMPRESSION: 1. Acute L1 compression deformity, with ventral canal encroachment. 2. Bilateral renal lesions. Left-sided lesion is likely renal cell carcinoma. The right-sided lesion is favored to represent a central renal cell carcinoma versus less likely urothelial carcinoma. There is also a smaller left-sided indeterminate renal lesion. If this patient is a treatment candidate, consider dedicated outpatient pre and post contrast abdominal MRI. 3. No evidence of abdominal metastasis.  Patent renal veins. 4. Tiny left and trace right pleural effusions. 5. "Giant" right hepatic lobe hemangioma. 6. Pelvic floor laxity. 7.  Aortic Atherosclerosis (ICD10-I70.0).  These results were called by telephone at the time of interpretation on 08/16/2019 at 12:49 pm to Dr. Lenise Arena , who verbally acknowledged these results.   Electronically Signed   By: Abigail Miyamoto M.D.   On: 08/16/2019 12:51  I personally reviewed the imaging above and note bilateral renal lesions, all <3cm.  Assessment & Plan:  Patient with incidental bilateral renal lesions, 2 of which are most concerning for RCC, all of which measure <3cm in diameter. No evidence of abdominal metastasis per CT.   Upon chart review, patient has previously declined further evaluation of the left renal lesion, which appears stable in size since 2010.  Given patient's age, normal renal function, and small size of these incidental lesions, we recommend surveillance at this time. Happy to see her in clinic as an outpatient to discuss further.  -Outpatient f/u in clinic to discuss surveillance  Thank you for involving me in this patient's care, please page with any further questions or concerns.  Debroah Loop, PA-C 08/20/2019 11:41 AM

## 2019-08-20 NOTE — Progress Notes (Signed)
Subjective: The patient states that she slept well last night and denies any pain in her back this morning.  She has no new complaints.   Objective: Vital signs in last 24 hours: Temp:  [97.5 F (36.4 C)-98.4 F (36.9 C)] 98.3 F (36.8 C) (09/08 1018) Pulse Rate:  [71-76] 73 (09/08 0426) Resp:  [18] 18 (09/08 1018) BP: (133-151)/(57-70) 144/57 (09/08 1018) SpO2:  [94 %-97 %] 94 % (09/08 1018)  Intake/Output from previous day: 09/07 0701 - 09/08 0700 In: 100 [IV Piggyback:100] Out: -  Intake/Output this shift: No intake/output data recorded.  Recent Labs    08/19/19 0606  HGB 13.2   Recent Labs    08/19/19 0606  WBC 6.3  RBC 4.72  HCT 42.4  PLT 232   Recent Labs    08/19/19 0606 08/20/19 0523  NA 142 144  K 3.2* 3.7  CL 109 109  CO2 23 26  BUN 11 12  CREATININE 0.53 0.48  GLUCOSE 80 124*  CALCIUM 7.9* 8.3*   No results for input(s): LABPT, INR in the last 72 hours.  Physical Exam: The patient's physical examination findings remain unchanged as compared to the examination from yesterday.  She remains neurovascularly intact to both lower extremities.  Assessment: Status post L1 compression fracture.  Plan: The patient may continue to be mobilized with physical therapy as symptoms permit, wearing her back extension brace whenever sitting up or standing up.  She may receive appropriate pain medication as medically indicated.  Thank you for asking me to participate in the care of this most pleasant woman.  I will sign off at this time.  Please re-consult me if you have any need for further input from me during this hospitalization.  Otherwise, please arrange for the patient to be seen back in our office in 1 month.   Marshall Cork Poggi 08/20/2019, 10:22 AM

## 2019-08-20 NOTE — TOC Progression Note (Addendum)
Transition of Care (TOC) - Progression Note    Patient Details  Name: Denise Garrison MRN: [id number] Date of Birth: Aug 24, 1923  Transition of Care [facility] CM/SW Contact  Keylin Ferryman, Lenice Llamas Phone Number: [phone]  08/20/2019, 10:15 AM  Clinical Narrative: Clinical Social Worker (CSW) started [facility] authorization today. CSW left a voicemail for [employer] admissions coordinator at [place] her aware that patient's daughter Denise Garrison accepted bed offer. CSW will continue to follow and assist as needed.    2:45 pm: [facility] authorization has been received, authorization # [id number]. Plan is for patient to D/C to [facility] pending medical clearance. Patient will need another covid test. MD aware of above. Patient's daughter Denise Garrison is aware of above.    Expected Discharge Plan: Skilled Nursing Facility Barriers to Discharge: SNF Pending bed offer  Expected Discharge Plan and Services Expected Discharge Plan: [place] In-house Referral: Clinical Social Work   Post Acute Care Choice: [facility] Living arrangements for the past 2 months: [place]                                       Social Determinants of Health (SDOH) Interventions    Readmission Risk Interventions No flowsheet data found.

## 2019-08-20 NOTE — Progress Notes (Signed)
[facility] [place] NAME: Denise Garrison    MR#:  [id number]  DATE OF BIRTH:  Mar 15, 1923  SUBJECTIVE: 85 year old female is admitted for UTI but found to have fracture, left renal cell carcinoma, patient told me that she never had diagnosis of cancer, denies any back pain, abdominal pain.  CHIEF COMPLAINT:   Chief Complaint  Patient presents with  . Back Pain  . Fall    Patient thinks that she is going to school today and is confused    REVIEW OF SYSTEMS:   ROS CONSTITUTIONAL: Unable to provide due to confusion DRUG ALLERGIES:  No Known Allergies  VITALS:  Blood pressure (!) 144/57, pulse 73, temperature 98.3 F (36.8 C), temperature source Oral, resp. rate 18, height 5\' 4"  (1.626 m), weight 54.4 kg, SpO2 94 %.  PHYSICAL EXAMINATION:  GENERAL:  86 y.o.-year-old patient lying in the bed with no acute distress.  EYES: Pupils equal, round, reactive to light and accommodation. No scleral icterus. Extraocular muscles intact.  HEENT: Head atraumatic, normocephalic. Oropharynx and nasopharynx clear.  NECK:  Supple, no jugular venous distention. No thyroid enlargement, no tenderness.  LUNGS: Normal breath sounds bilaterally, no wheezing, rales,rhonchi or crepitation. No use of accessory muscles of respiration.  CARDIOVASCULAR: S1, S2 normal. No murmurs, rubs, or gallops.  ABDOMEN: Soft, nontender, nondistended. Bowel sounds present. No organomegaly or mass.  EXTREMITIES: No pedal edema, cyanosis, or clubbing.  NEUROLOGIC: Limited exam due to patient follow commands PSYCHIATRIC: Patient is confused SKIN: No obvious rash, lesion, or ulcer.    LABORATORY PANEL:   CBC Recent Labs  Lab 08/19/19 0606  WBC 6.3  HGB 13.2  HCT 42.4  PLT 232   ------------------------------------------------------------------------------------------------------------------  Chemistries  Recent Labs  Lab 08/16/19 1111  08/20/19 0523  NA 145    < > 144  K 4.0   < > 3.7  CL 108   < > 109  CO2 28   < > 26  GLUCOSE 101*   < > 124*  BUN 14   < > 12  CREATININE 0.57   < > 0.48  CALCIUM 8.7*   < > 8.3*  AST 16  --   --   ALT 12  --   --   ALKPHOS 59  --   --   BILITOT 0.7  --   --    < > = values in this interval not displayed.   ------------------------------------------------------------------------------------------------------------------  Cardiac Enzymes No results for input(s): TROPONINI in the last 168 hours. ------------------------------------------------------------------------------------------------------------------  RADIOLOGY:  No results found.  EKG:   Orders placed or performed in visit on 01/06/17  . EKG 12-Lead    ASSESSMENT AND PLAN:   85 year old female patient history of, hyperlipidemia, osteoporosis.  Yesterday because of back pain, fall.    1.  Urinary tract infection continue IV antibiotics awaiting urine cultures  2.  Back pain, patient had L1 compression fracture, seen by orthopedics who recommends ambulation   3.  Chronic atrial fibrillation, heart rate is elevated I will start on oral Cardizem    4.  Newly diagnosed left renal cell cancer, patient has no symptoms, appreciate urology input plan for follow-up as outpatient  5.  Hypokalemia due to poor p.o. intake, replace potassium.  6.  Acute delirium due to acute hospitalization patient's age All the records are reviewed and case discussed with Care Management/Social Workerr. Management plans discussed with the patient, family and they are in agreement.  CODE STATUS:full  total time taking care of this patient; 35 minutes  More than 50% time spent in counseling, coordination of care POSSIBLE D/C IN 2 to 3 days DAYS, DEPENDING ON CLINICAL CONDITION.   Dustin Flock M.D on 08/20/2019 at 12:48 PM  Between 7am to 6pm - Pager - [phone]  After 6pm go to [email or web address] - password [email or web address] [place] [facility]   Office  [phone]  CC: Primary care physician; Einar Pheasant, MD   Note: This dictation was prepared with Dragon dictation along with smaller phrase technology. Any transcriptional errors that result from this process are unintentional.

## 2019-08-20 NOTE — Progress Notes (Signed)
Physical Therapy Treatment Patient Details Name: Denise Garrison. [place] MRN: [id number] DOB: [birth year] Today's Date: 08/20/2019    History of Present Illness Denise Garrison is a 13yoF who comes to [facility] after a fall at home, found to have acute L1 fracture. Pt issued TLSO, CSW searching for bed placement, as pt lived alone PTA adn now is having difficulty AMB d/t back pain.    PT Comments    Pt in room upon arrival with Urology PA requesting to use commode.  Assisted to bathroom with HHA +2 to void.  Ambulated out of bathroom with RW and min a x 1.  After seated rest and finished consult with PA, she was able to stand and ambulate around small nursing pod with RW and min a x 1.  Pt with general c/o minimal back soreness from fall and fractures.  She requires hands on assist at all times for gait for balance and general safety.  She does fatigue with ambulation but HR remains stable today and does not interfere with mobility skills.  SNF remains appropriate at this time.   Follow Up Recommendations  SNF     Equipment Recommendations       Recommendations for Other Services       Precautions / Restrictions Precautions Precautions: Fall Required Braces or Orthoses: Spinal Brace Spinal Brace: Thoracolumbosacral orthotic Restrictions Weight Bearing Restrictions: No    Mobility  Bed Mobility               General bed mobility comments: in recliner with TSLO donned by nursing.  Transfers Overall transfer level: Needs assistance Equipment used: Rolling walker (2 wheeled) Transfers: Sit to/from Stand Sit to Stand: Min assist            Ambulation/Gait Ambulation/Gait assistance: Min Web designer (Feet): 150 Feet Assistive device: Rolling walker (2 wheeled) Gait Pattern/deviations: Step-through pattern;Narrow base of support Gait velocity: decreased   General Gait Details: HR stable during session.  Limited by fatigue.   Stairs             Wheelchair  Mobility    Modified Rankin (Stroke Patients Only)       Balance Overall balance assessment: Needs assistance Sitting-balance support: Feet supported Sitting balance-Leahy Scale: Fair     Standing balance support: Bilateral upper extremity supported Standing balance-Leahy Scale: Fair                              Cognition Arousal/Alertness: Awake/alert Behavior During Therapy: WFL for tasks assessed/performed Overall Cognitive Status: No family/caregiver present to determine baseline cognitive functioning                                        Exercises Other Exercises Other Exercises: to commode to void    General Comments        Pertinent Vitals/Pain Pain Assessment: Faces Faces Pain Scale: Hurts a little bit Pain Location: low back Pain Descriptors / Indicators: Sore Pain Intervention(s): Monitored during session    Home Living                      Prior Function            PT Goals (current goals can now be found in the care plan section) Progress towards PT goals: Progressing toward goals    Frequency  7X/week      PT Plan Current plan remains appropriate    Co-evaluation              AM-PAC PT "6 Clicks" Mobility   Outcome Measure  Help needed turning from your back to your side while in a flat bed without using bedrails?: A Little Help needed moving from lying on your back to sitting on the side of a flat bed without using bedrails?: A Lot Help needed moving to and from a bed to a chair (including a wheelchair)?: A Little Help needed standing up from a chair using your arms (e.g., wheelchair or bedside chair)?: A Little Help needed to walk in hospital room?: A Little Help needed climbing 3-5 steps with a railing? : A Lot 6 Click Score: 16    End of Session Equipment Utilized During Treatment: Gait belt;Back brace Activity Tolerance: Patient tolerated treatment well Patient left: in chair;with  call bell/phone within reach;with chair alarm set         Time: 0944-1000 PT Time Calculation (min) (ACUTE ONLY): 16 min  Charges:  $Gait Training: 8-22 mins                    Chesley Noon, PTA 08/20/19, 10:36 AM

## 2019-08-21 DIAGNOSIS — S32010D Wedge compression fracture of first lumbar vertebra, subsequent encounter for fracture with routine healing: Secondary | ICD-10-CM | POA: Diagnosis not present

## 2019-08-21 DIAGNOSIS — U071 COVID-19: Secondary | ICD-10-CM | POA: Diagnosis not present

## 2019-08-21 DIAGNOSIS — Z7401 Bed confinement status: Secondary | ICD-10-CM | POA: Diagnosis not present

## 2019-08-21 DIAGNOSIS — N2889 Other specified disorders of kidney and ureter: Secondary | ICD-10-CM | POA: Diagnosis not present

## 2019-08-21 DIAGNOSIS — R4189 Other symptoms and signs involving cognitive functions and awareness: Secondary | ICD-10-CM | POA: Diagnosis not present

## 2019-08-21 DIAGNOSIS — S32019D Unspecified fracture of first lumbar vertebra, subsequent encounter for fracture with routine healing: Secondary | ICD-10-CM | POA: Diagnosis not present

## 2019-08-21 DIAGNOSIS — R41841 Cognitive communication deficit: Secondary | ICD-10-CM | POA: Diagnosis not present

## 2019-08-21 DIAGNOSIS — C642 Malignant neoplasm of left kidney, except renal pelvis: Secondary | ICD-10-CM | POA: Diagnosis not present

## 2019-08-21 DIAGNOSIS — F329 Major depressive disorder, single episode, unspecified: Secondary | ICD-10-CM | POA: Diagnosis not present

## 2019-08-21 DIAGNOSIS — I4891 Unspecified atrial fibrillation: Secondary | ICD-10-CM | POA: Diagnosis not present

## 2019-08-21 DIAGNOSIS — R630 Anorexia: Secondary | ICD-10-CM | POA: Diagnosis not present

## 2019-08-21 DIAGNOSIS — R140 Abdominal distension (gaseous): Secondary | ICD-10-CM | POA: Diagnosis not present

## 2019-08-21 DIAGNOSIS — R0902 Hypoxemia: Secondary | ICD-10-CM | POA: Diagnosis not present

## 2019-08-21 DIAGNOSIS — R001 Bradycardia, unspecified: Secondary | ICD-10-CM | POA: Diagnosis not present

## 2019-08-21 DIAGNOSIS — E0789 Other specified disorders of thyroid: Secondary | ICD-10-CM | POA: Diagnosis not present

## 2019-08-21 DIAGNOSIS — M2550 Pain in unspecified joint: Secondary | ICD-10-CM | POA: Diagnosis not present

## 2019-08-21 DIAGNOSIS — M545 Low back pain: Secondary | ICD-10-CM | POA: Diagnosis not present

## 2019-08-21 DIAGNOSIS — R404 Transient alteration of awareness: Secondary | ICD-10-CM | POA: Diagnosis not present

## 2019-08-21 DIAGNOSIS — W19XXXA Unspecified fall, initial encounter: Secondary | ICD-10-CM | POA: Diagnosis not present

## 2019-08-21 DIAGNOSIS — M6281 Muscle weakness (generalized): Secondary | ICD-10-CM | POA: Diagnosis not present

## 2019-08-21 DIAGNOSIS — Z789 Other specified health status: Secondary | ICD-10-CM | POA: Diagnosis not present

## 2019-08-21 DIAGNOSIS — M810 Age-related osteoporosis without current pathological fracture: Secondary | ICD-10-CM | POA: Diagnosis not present

## 2019-08-21 DIAGNOSIS — N390 Urinary tract infection, site not specified: Secondary | ICD-10-CM | POA: Diagnosis not present

## 2019-08-21 DIAGNOSIS — R109 Unspecified abdominal pain: Secondary | ICD-10-CM | POA: Diagnosis not present

## 2019-08-21 DIAGNOSIS — E7800 Pure hypercholesterolemia, unspecified: Secondary | ICD-10-CM | POA: Diagnosis not present

## 2019-08-21 DIAGNOSIS — F0390 Unspecified dementia without behavioral disturbance: Secondary | ICD-10-CM | POA: Diagnosis not present

## 2019-08-21 DIAGNOSIS — W19XXXD Unspecified fall, subsequent encounter: Secondary | ICD-10-CM | POA: Diagnosis not present

## 2019-08-21 DIAGNOSIS — Z9181 History of falling: Secondary | ICD-10-CM | POA: Diagnosis not present

## 2019-08-21 DIAGNOSIS — R2689 Other abnormalities of gait and mobility: Secondary | ICD-10-CM | POA: Diagnosis not present

## 2019-08-21 LAB — BASIC METABOLIC PANEL
Anion gap: 8 (ref 5–15)
BUN: 11 mg/dL (ref 8–23)
CO2: 27 mmol/L (ref 22–32)
Calcium: 8.3 mg/dL — ABNORMAL LOW (ref 8.9–10.3)
Chloride: 107 mmol/L (ref 98–111)
Creatinine, Ser: 0.53 mg/dL (ref 0.44–1.00)
GFR calc Af Amer: >60 mL/min (ref >60)
GFR calc non Af Amer: >60 mL/min (ref >60)
Glucose, Bld: 111 mg/dL — ABNORMAL HIGH (ref 70–99)
Potassium: 3.6 mmol/L (ref 3.5–5.1)
Sodium: 142 mmol/L (ref 135–145)

## 2019-08-21 LAB — CBC
HCT: 36.6 % (ref 36.0–46.0)
Hemoglobin: 11.6 g/dL — ABNORMAL LOW (ref 12.0–15.0)
MCH: 28.2 pg (ref 26.0–34.0)
MCHC: 31.7 g/dL (ref 30.0–36.0)
MCV: 88.8 fL (ref 80.0–100.0)
Platelets: 249 10*3/uL (ref 150–400)
RBC: 4.12 MIL/uL (ref 3.87–5.11)
RDW: 14.6 % (ref 11.5–15.5)
WBC: 7 10*3/uL (ref 4.0–10.5)
nRBC: 0 % (ref 0.0–0.2)

## 2019-08-21 LAB — SARS CORONAVIRUS 2 BY RT PCR (HOSPITAL ORDER, PERFORMED IN ~~LOC~~ HOSPITAL LAB): SARS Coronavirus 2: NEGATIVE

## 2019-08-21 MED ORDER — DOCUSATE SODIUM 100 MG PO CAPS: 100.0000 mg | ORAL_CAPSULE | Freq: Two times a day (BID) | ORAL | 0 refills | Status: DC

## 2019-08-21 MED ORDER — CEFUROXIME AXETIL 500 MG PO TABS: 500.0000 mg | ORAL_TABLET | Freq: Two times a day (BID) | ORAL | 0 refills | Status: AC

## 2019-08-21 MED ORDER — ACETAMINOPHEN 325 MG PO TABS: 650.0000 mg | ORAL_TABLET | Freq: Four times a day (QID) | ORAL | Status: DC | PRN

## 2019-08-21 NOTE — Discharge Summary (Signed)
[facility] [place]. Denise Garrison, 85 y.o., DOB [birth year], MRN [id number]. Admission date: 08/16/2019 Discharge Date 08/21/2019 Primary MD Einar Pheasant, MD Admitting Physician Lance Coon, MD  Admission Diagnosis  Renal mass [N28.89] Fall, initial encounter [W19.XXXA] Closed compression fracture of body of L1 vertebra ([place]) [S32.010A]  Discharge Diagnosis   Principal Problem: Acute cystitis Back pain with closed L1 compression Chronic atrial fibrillation not a good candidate for anticoagulation due to falls Bradycardia due to Cardizem therapy which has been discontinued Newly diagnosed left renal cell cancer status post urology evaluation felt not to be a candidate for any type of intervention due to based on age Hypercholesterolemia [name]  is a 85 y.o. female who presents with chief complaint as above.  Patient presents to the emergency department several days ago with a complaint of back pain after a fall.  She was found to have an L1 vertebral compression fracture.  Patient also was noted to have abnormal UA.  Her urine culture did not grow any significant bacteria.  She was treated for cystitis.  She also also was having pain in her back due to L1 compression fracture was seen by orthopedics who recommended conservative treatment.  And brace.  Patient also had some confusion during hospitalization she does have dementia with acute delirium.  Patient was incidentally noted to have a likely renal cell cancer based on the CT scan she was seen by urology who recommended outpatient follow-up.  Based on advanced age she is not considered a good candidate for any type of nephrectomy.  She will follow-up outpatient for this.     Palliative care team to follow patient at rehab facility   Note patient's urine still appears cloudy and has a lot of sediment suspect this is related to the renal cell  carcinoma    Consults  urology  Significant Tests:  See full reports for all details     Dg Thoracic Spine 2 View  Result Date: 08/16/2019 CLINICAL DATA:  Unwitnessed fall. Back pain. EXAM: THORACIC SPINE 2 VIEWS COMPARISON:  None FINDINGS: There is exaggeration of the normal thoracic kyphosis. There is no significant listhesis. The bones are diffusely osteopenic. Thoracic spondylosis is noted with multilevel ankylosis in the mid and upper thoracic spine. No acute thoracic spine fracture is identified. The visualized portions of the lungs are grossly clear. Aortic atherosclerosis is noted. IMPRESSION: No acute osseous abnormality identified. Thoracic spondylosis and ankylosis. Electronically Signed   By: Logan Bores M.D.   On: 08/16/2019 10:27   Dg Lumbar Spine 2-3 Views  Result Date: 08/16/2019 CLINICAL DATA:  Unwitnessed fall. Back pain. EXAM: LUMBAR SPINE - 2-3 VIEW COMPARISON:  None. FINDINGS: Vertebral alignment is normal. There is an L1 superior endplate compression fracture with mild vertebral body height loss. Intervertebral disc space heights are preserved. Lower lumbar facet arthrosis is most notable on the left at L5-S1. Cholecystectomy clips are noted. There is aortic atherosclerosis. IMPRESSION: L1 superior endplate compression fracture, favored to be acute. Electronically Signed   By: Logan Bores M.D.   On: 08/16/2019 10:29   Dg Pelvis 1-2 Views  Result Date: 08/16/2019 CLINICAL DATA:  Unwitnessed fall. Back pain. EXAM: PELVIS - 1-2 VIEW COMPARISON:  None. FINDINGS: Axial hip joint space narrowing is more notable on the left where there is moderate marginal osteophytosis. No hip dislocation is evident on this single AP radiograph. The SI joints are approximated.  Small calcifications project in the right pelvis, possibly phleboliths. IMPRESSION: No acute osseous abnormality identified. Left greater than right hip osteoarthrosis. Electronically Signed   By: Logan Bores M.D.   On:  08/16/2019 10:32   Ct Abdomen Pelvis W Contrast  Result Date: 08/16/2019 CLINICAL DATA:  Abdominal pain. Unwitnessed fall from bed. Hysterectomy and cholecystectomy. History of melanoma. EXAM: CT ABDOMEN AND PELVIS WITH CONTRAST TECHNIQUE: Multidetector CT imaging of the abdomen and pelvis was performed using the standard protocol following bolus administration of intravenous contrast. CONTRAST:  46m OMNIPAQUE IOHEXOL 300 MG/ML  SOLN COMPARISON:  09/01/2009 abdominal ultrasound.  No comparison CT. FINDINGS: Lower chest: Bibasilar scarring. Right middle lobe dominant bulla or bleb. Minimal motion degradation. Mild cardiomegaly with small left and trace right pleural effusions. Hepatobiliary: High right hepatic lobe hemangioma at 6.4 x 4.0 cm on 22/2 left hepatic lobe cysts. Cholecystectomy, without biliary ductal dilatation. Pancreas: No pancreatic duct dilatation or acute inflammation. Suspected cystic lesion in the pancreatic tail of 3 mm on 32/2 is of doubtful clinical significance given patient age. Spleen: Low-density subcentimeter splenic lesion is also of no clinical significance. Adrenals/Urinary Tract: Mild left adrenal thickening. Normal right adrenal gland. 1.3 cm interpolar left renal lesion demonstrates postcontrast characteristics which are suspicious for renal cell carcinoma, including on 33/2. A more posterior interpolar left renal 9 mm lesion on 34/2 demonstrates complexity along its lateral portion. Upper pole left renal too small to characterize lesion. Central right interpolar heterogeneous right renal mass measures 2.1 x 2.0 cm on 44/2 and 35/5. Also demonstrates "deenhancement" on delayed images, consistent with a solid mass. There also bilateral renal sinus cysts. No hydronephrosis. Normal urinary bladder. Stomach/Bowel: Normal stomach, without wall thickening. Extensive colonic diverticulosis. Normal terminal ileum. Normal small bowel. Vascular/Lymphatic: Advanced aortic and branch vessel  atherosclerosis. Patent renal veins. No abdominopelvic adenopathy. Reproductive: Hysterectomy.  No adnexal mass. Other: No significant free fluid.  Moderate pelvic floor laxity. Musculoskeletal: Osteopenia. Remote lower posterior left rib fractures. Acute mild to moderate L1 compression deformity with mild ventral canal encroachment, including on sagittal image 63. IMPRESSION: 1. Acute L1 compression deformity, with ventral canal encroachment. 2. Bilateral renal lesions. Left-sided lesion is likely renal cell carcinoma. The right-sided lesion is favored to represent a central renal cell carcinoma versus less likely urothelial carcinoma. There is also a smaller left-sided indeterminate renal lesion. If this patient is a treatment candidate, consider dedicated outpatient pre and post contrast abdominal MRI. 3. No evidence of abdominal metastasis.  Patent renal veins. 4. Tiny left and trace right pleural effusions. 5. "Giant" right hepatic lobe hemangioma. 6. Pelvic floor laxity. 7.  Aortic Atherosclerosis (ICD10-I70.0). These results were called by telephone at the time of interpretation on 08/16/2019 at 12:49 pm to Dr. JLenise Arena, who verbally acknowledged these results. Electronically Signed   By: KAbigail MiyamotoM.D.   On: 08/16/2019 12:51       Today   Subjective:   Amany Heavrin patient pleasantly confused overnight was noted to have bradycardia due to Cardizem medication Objective:   Blood pressure 105/61, pulse 69, temperature 97.9 F (36.6 C), temperature source Oral, resp. rate 18, height 5' 4"  (1.626 m), weight 54.4 kg, SpO2 91 %.  .  Intake/Output Summary (Last 24 hours) at 08/21/2019 0845 Last data filed at 08/21/2019 0335 Gross per 24 hour  Intake 246.12 ml  Output -  Net 246.12 ml    Exam VITAL SIGNS: Blood pressure 105/61, pulse 69, temperature 97.9 F (36.6 C), temperature source  Oral, resp. rate 18, height 5' 4"  (1.626 m), weight 54.4 kg, SpO2 91 %.  GENERAL:  85  y.o.-year-old patient lying in the bed with no acute distress.  EYES: Pupils equal, round, reactive to light and accommodation. No scleral icterus. Extraocular muscles intact.  HEENT: Head atraumatic, normocephalic. Oropharynx and nasopharynx clear.  NECK:  Supple, no jugular venous distention. No thyroid enlargement, no tenderness.  LUNGS: Normal breath sounds bilaterally, no wheezing, rales,rhonchi or crepitation. No use of accessory muscles of respiration.  CARDIOVASCULAR: S1, S2 normal. No murmurs, rubs, or gallops.  ABDOMEN: Soft, nontender, nondistended. Bowel sounds present. No organomegaly or mass.  EXTREMITIES: No pedal edema, cyanosis, or clubbing.  NEUROLOGIC: Cranial nerves II through XII are intact. Muscle strength 5/5 in all extremities. Sensation intact. Gait not checked.  PSYCHIATRIC: Patient is alert but not oriented SKIN: No obvious rash, lesion, or ulcer.   Data Review     CBC w Diff:  Lab Results  Component Value Date   WBC 7.0 08/21/2019   HGB 11.6 (L) 08/21/2019   HCT 36.6 08/21/2019   PLT 249 08/21/2019   LYMPHOPCT 14 08/16/2019   MONOPCT 6 08/16/2019   EOSPCT 1 08/16/2019   BASOPCT 0 08/16/2019   CMP:  Lab Results  Component Value Date   NA 142 08/21/2019   K 3.6 08/21/2019   CL 107 08/21/2019   CO2 27 08/21/2019   BUN 11 08/21/2019   CREATININE 0.53 08/21/2019   CREATININE 0.78 03/07/2014   PROT 5.9 (L) 08/16/2019   ALBUMIN 3.5 08/16/2019   BILITOT 0.7 08/16/2019   ALKPHOS 59 08/16/2019   AST 16 08/16/2019   ALT 12 08/16/2019  .  Micro Results Recent Results (from the past 240 hour(s))  Urine culture     Status: Abnormal   Collection Time: 08/18/19  1:49 PM   Specimen: Urine, Random  Result Value Ref Range Status   Specimen Description   Final    URINE, RANDOM Performed at [facility], [address] [place], [place] [address]    Special Requests   Final    NONE Performed at [facility], [address]  [place], [place] [address]    Culture (A)  Final    <10,000 COLONIES/mL INSIGNIFICANT GROWTH Performed at [facility], [place] [address] [place], [place] [address]    Report Status 08/20/2019 FINAL  Final  SARS CORONAVIRUS 2 (TAT 6-24 HRS) Nasopharyngeal Nasopharyngeal Swab     Status: None   Collection Time: 08/18/19  8:09 PM   Specimen: Nasopharyngeal Swab  Result Value Ref Range Status   SARS Coronavirus 2 NEGATIVE NEGATIVE Final    Comment: (NOTE) SARS-CoV-2 target nucleic acids are NOT DETECTED. The SARS-CoV-2 RNA is generally detectable in upper and lower respiratory specimens during the acute phase of infection. Negative results do not preclude SARS-CoV-2 infection, do not rule out co-infections with other pathogens, and should not be used as the sole basis for treatment or other patient management decisions. Negative results must be combined with clinical observations, patient history, and epidemiological information. The expected result is Negative. Fact Sheet for Patients: SugarRoll.be Fact Sheet for Healthcare Providers: https://www.woods-mathews.com/ This test is not yet approved or cleared by the [place] FDA and  has been authorized for detection and/or diagnosis of SARS-CoV-2 by FDA under an Emergency Use Authorization (EUA). This EUA will remain  in effect (meaning this test can be used) for the duration of the COVID-19 declaration under Section 56 4(b)(1)  of the Act, 21 U.S.C. section 360bbb-3(b)(1), unless the authorization is terminated or revoked sooner. Performed at [facility], [place] [address], [place], [place] [address]         Code Status Orders  (From admission, onward)         Start     Ordered   08/20/19 1257  Do not attempt resuscitation (DNR)  Continuous    Question Answer Comment  In the event of cardiac or respiratory ARREST Do not call a "code blue"   In the event of cardiac or  respiratory ARREST Do not perform Intubation, CPR, defibrillation or ACLS   In the event of cardiac or respiratory ARREST Use medication by any route, position, wound care, and other measures to relive pain and suffering. May use oxygen, suction and manual treatment of airway obstruction as needed for comfort.      08/20/19 1256        Code Status History    Date Active Date Inactive Code Status Order ID Comments User Context   08/18/2019 2143 08/20/2019 1256 Full Code [id number]  Lance Coon, MD Inpatient   Advance Care Planning Activity    Advance Directive Documentation     Most Recent Value  Type of Advance Directive  Healthcare Power of Attorney, Living will  Pre-existing out of facility DNR order (yellow form or pink MOST form)  -  "MOST" Form in Place?  -           Contact information for follow-up providers    Schedule an appointment as soon as possible for a visit  with Einar Pheasant, MD.   Specialty: Internal Medicine Why: For recheck Contact information: [address] [place] [address] [phone]        Schedule an appointment as soon as possible for a visit  with Sindy Guadeloupe, MD.   Specialty: Oncology Why: For recheck Contact information: [place] [address] [phone]        Abbie Sons, MD Follow up in 2 week(s).   Specialty: Urology Why: renal cancer Contact information: [place] [address] [phone]            Contact information for after-discharge care    Destination    [facility] .   Service: Skilled Nursing Contact information: [address] [place] [phone]                  Discharge Medications   Allergies as of 08/21/2019   No Known Allergies     Medication List    TAKE these medications   acetaminophen 325 MG tablet Commonly known as: TYLENOL Take 2 tablets  (650 mg total) by mouth every 6 (six) hours as needed for mild pain (or Fever >/= 101).   B-12 Compliance Injection 1000 MCG/ML Kit Generic drug: Cyanocobalamin Inject 1,000 mcg as directed every 30 (thirty) days.   cefUROXime 500 MG tablet Commonly known as: CEFTIN Take 1 tablet (500 mg total) by mouth 2 (two) times daily for 4 days.   docusate sodium 100 MG capsule Commonly known as: COLACE Take 1 capsule (100 mg total) by mouth 2 (two) times daily.          Total Time in preparing paper work, data evaluation and todays exam - 20 minutes  Dustin Flock M.D on 08/21/2019 at [place]  [phone]

## 2019-08-21 NOTE — Progress Notes (Signed)
Physical Therapy Treatment Patient Details Name: Denise Garrison. Karmann MRN: [id number] DOB: 09-10-1923 Today's Date: 08/21/2019    History of Present Illness Hatsue Can is a 19yoF who comes to [facility] after a fall at home, found to have acute L1 fracture. Pt issued TLSO, CSW searching for bed placement, as pt lived alone PTA adn now is having difficulty AMB d/t back pain.    PT Comments    Pt agreeable to PT; reports back is sore. Pt unsure at to why. Pt resting HR 117 bpm; son in room notes HR has been running high (documented vitals show varying). Pt participates in limited supine exercises mostly actively with intermittent assist; HR increases to mid 130's with work. Pt is on telemetry monitor. Pt repositioned upward in bed for comfort and assists well with BLEs. No further PT interventions attempted at this time due to HR. Pt is awaiting Covid test results and plans to discharge to skilled nursing facility to continue rehab efforts today.    Follow Up Recommendations  SNF     Equipment Recommendations       Recommendations for Other Services       Precautions / Restrictions Precautions Precautions: Fall Precaution Comments: presumptive back precautions, as TLSO resists trunk flexion, pt will be asked to avoid trunk flexion when in bed and TLSO is doffed. Required Braces or Orthoses: Spinal Brace Spinal Brace: Thoracolumbosacral orthotic(not donned at this time; not seen in room) Restrictions Weight Bearing Restrictions: No    Mobility  Bed Mobility Overal bed mobility: Needs Assistance Bed Mobility: Rolling(repositioning upward in bed) Rolling: Min assist         General bed mobility comments: Uses LEs well to respoition upward in bed with Min A  Transfers                 General transfer comment: Deferred; HR into mid 130s with limited supine bed exercises  Ambulation/Gait                 Stairs             Wheelchair Mobility    Modified  Rankin (Stroke Patients Only)       Balance                                            Cognition Arousal/Alertness: Awake/alert Behavior During Therapy: WFL for tasks assessed/performed Overall Cognitive Status: Within Functional Limits for tasks assessed                                 General Comments: Mild confusion as to situation       Exercises General Exercises - Lower Extremity Ankle Circles/Pumps: AROM;Both;10 reps Quad Sets: Strengthening;Both;10 reps Short Arc Quad: AROM;Both;10 reps Heel Slides: AROM;Both;10 reps Hip ABduction/ADduction: AROM;Both;10 reps    General Comments        Pertinent Vitals/Pain Pain Assessment: Faces Faces Pain Scale: Hurts a little bit Pain Location: back Pain Descriptors / Indicators: Sore Pain Intervention(s): Limited activity within patient's tolerance;Monitored during session    Home Living                      Prior Function            PT Goals (current goals can now be found in  the care plan section) Progress towards PT goals: Progressing toward goals    Frequency    7X/week      PT Plan Current plan remains appropriate    Co-evaluation              AM-PAC PT "6 Clicks" Mobility   Outcome Measure  Help needed turning from your back to your side while in a flat bed without using bedrails?: A Little Help needed moving from lying on your back to sitting on the side of a flat bed without using bedrails?: A Lot Help needed moving to and from a bed to a chair (including a wheelchair)?: A Little Help needed standing up from a chair using your arms (e.g., wheelchair or bedside chair)?: A Little Help needed to walk in hospital room?: A Lot Help needed climbing 3-5 steps with a railing? : A Lot 6 Click Score: 15    End of Session   Activity Tolerance: Patient tolerated treatment well Patient left: in bed;with call bell/phone within reach;with bed alarm set;with  family/visitor present   PT Visit Diagnosis: Other abnormalities of gait and mobility (R26.89);Difficulty in walking, not elsewhere classified (R26.2);Other symptoms and signs involving the nervous system (R29.898);Muscle weakness (generalized) (M62.81)     Time: [id number] PT Time Calculation (min) (ACUTE ONLY): 25 min  Charges:  $Therapeutic Exercise: 8-22 mins $Therapeutic Activity: 8-22 mins                      Larae Grooms, PTA 08/21/2019, 1:15 PM

## 2019-08-21 NOTE — TOC Transition Note (Signed)
Transition of Care (TOC) - CM/SW Discharge Note   Patient Details  Name: Sherrin M. Danzy MRN: [id number] Date of Birth: 07/23/1923  Transition of Care [facility]) CM/SW Contact:  Candie Chroman, LCSW Phone Number: 08/21/2019, 12:13 PM   Clinical Narrative: Patient's COVID test from today is negative. Patient has orders to discharge to [facility] today. RN will call report [phone] Room 905A) prior to calling EMS transport around 2:00. Daughter [name] is aware. No further concerns. CSW signing off.  Final next level of care: Skilled Nursing Facility Barriers to Discharge: Barriers Resolved   Patient Goals and CMS Choice Patient states their goals for this hospitalization and ongoing recovery are:: HPOA states that she would like her mother to be discharged to a skills nursing facility, SNF CMS Medicare.gov Compare Post Acute Care list provided to:: Patient Represenative (must comment)(HCPOA) Choice offered to / list presented to : Adult Children(HCPA)  Discharge Placement PASRR number recieved: 08/18/19            Patient chooses bed at: [facility] Patient to be transferred to facility by: EMS Name of family member notified: Dannette [place] Patient and family notified of of transfer: 08/21/19  Discharge Plan and Services In-house Referral: Clinical Social Work   Post Acute Care Choice: [place]                               Social Determinants of Health (SDOH) Interventions     Readmission Risk Interventions No flowsheet data found.

## 2019-08-21 NOTE — Progress Notes (Addendum)
Patient had a 2.4 second SVR pause at 2144 and a 2.8 second SVR pause at 2201 and then later she had a 3.6 second arrest. Messaged MD new order for Calcium IVPB, Magnesium IVPB, EKG, Lab draw. MD (Dr. Jannifer Franklin) also came to floor to see patient.  Throughout the night she had periods of bradycardia. Per Dr. Marcille [place] to hold 0600 Diltiazem.

## 2019-08-21 NOTE — Progress Notes (Signed)
Patient discharged to [facility]. Report given to Graham at facility. EMS called for transport.

## 2019-08-26 DIAGNOSIS — U071 COVID-19: Secondary | ICD-10-CM | POA: Diagnosis not present

## 2019-09-02 DIAGNOSIS — U071 COVID-19: Secondary | ICD-10-CM | POA: Diagnosis not present

## 2019-09-05 DIAGNOSIS — S32019D Unspecified fracture of first lumbar vertebra, subsequent encounter for fracture with routine healing: Secondary | ICD-10-CM | POA: Diagnosis not present

## 2019-09-11 ENCOUNTER — Other Ambulatory Visit: Payer: Self-pay

## 2019-09-11 ENCOUNTER — Ambulatory Visit (INDEPENDENT_AMBULATORY_CARE_PROVIDER_SITE_OTHER): Payer: PPO | Admitting: Urology

## 2019-09-11 ENCOUNTER — Encounter: Payer: Self-pay | Admitting: Urology

## 2019-09-11 DIAGNOSIS — N2889 Other specified disorders of kidney and ureter: Secondary | ICD-10-CM | POA: Diagnosis not present

## 2019-09-11 NOTE — Progress Notes (Signed)
09/11/2019 9:29 AM   Denise Garrison 1923-10-25 [id number]  Referring provider: Einar Pheasant, [address] [place],  [place] [address]  Chief Complaint  Patient presents with   Renal Mass    HPI: 85 year old female recently admitted after a fall sustaining an L1 vertebral fracture after a fall.  As part of her evaluation, she underwent a CT of the abdomen pelvis with contrast on 08/16/2019 which showed small bilateral renal masses.  She was seen during his inpatient admission by Dr. Guadalupe Maple for inpatient consultation at which time surveillance was recommended.  She returns today to discuss this further accompanied by her daughter.  Notably, during the admission, she was diagnosed with a "UTI" however she was asymptomatic and urine culture was ultimately negative.  She denies any urinary symptoms today including frequency, urgency, dysuria, or any other symptoms.  No fevers or chills.  She does have multiple medical comorbidities including dementia, chronic A. fib, etc.   PMH: Past Medical History:  Diagnosis Date   B12 deficiency    Cancer ([facility])    history of melanoma, s/p excision x 3   Cavernous hemangioma    History of   Hiatal hernia    History of   Hx of detached retina repair    Hypercholesterolemia    Osteoporosis     Surgical History: Past Surgical History:  Procedure Laterality Date   ABDOMINAL HYSTERECTOMY  1975   Secondary to fibroid tumors   BREAST BIOPSY     Benign   CHOLECYSTECTOMY  4/91   EYE SURGERY     Bilateral Cataract    SKIN CANCER EXCISION     Malignant Melanoma x3    Home Medications:  Allergies as of 09/11/2019   No Known Allergies     Medication List       Accurate as of September 11, 2019  9:29 AM. If you have any questions, ask your nurse or doctor.        acetaminophen 325 MG tablet Commonly known as: TYLENOL Take 2 tablets (650 mg total) by mouth every 6 (six) hours as  needed for mild pain (or Fever >/= 101).   B-12 Compliance Injection 1000 MCG/ML Kit Generic drug: Cyanocobalamin Inject 1,000 mcg as directed every 30 (thirty) days.   D 5000 125 MCG (5000 UT) capsule Generic drug: Cholecalciferol Take by mouth.   docusate sodium 100 MG capsule Commonly known as: COLACE Take 1 capsule (100 mg total) by mouth 2 (two) times daily.       Allergies: No Known Allergies  Family History: Family History  Problem Relation Age of Onset   Kidney disease Father    Heart disease Father    Kidney disease Brother    Parkinson's disease Brother    Melanoma Brother    Breast cancer Neg Hx    Colon cancer Neg Hx     Social History:  reports that she has never smoked. She has never used smokeless tobacco. She reports current alcohol use. She reports that she does not use drugs.  ROS: UROLOGY Frequent Urination?: No Hard to postpone urination?: No Burning/pain with urination?: No Get up at night to urinate?: No Leakage of urine?: No Urine stream starts and stops?: No Trouble starting stream?: No Do you have to strain to urinate?: No Blood in urine?: No Urinary tract infection?: No Sexually transmitted disease?: No Injury to kidneys or bladder?: No Painful intercourse?: No Weak stream?: No Currently pregnant?: No Vaginal bleeding?: No  Last menstrual period?: n  Gastrointestinal Nausea?: No Vomiting?: No Indigestion/heartburn?: No Diarrhea?: No Constipation?: No  Constitutional Fever: No Night sweats?: No Weight loss?: No Fatigue?: No  Skin Skin rash/lesions?: No Itching?: No  Eyes Blurred vision?: No Double vision?: No  Ears/Nose/Throat Sore throat?: No Sinus problems?: No  Hematologic/Lymphatic Swollen glands?: No Easy bruising?: No  Cardiovascular Leg swelling?: No Chest pain?: No  Respiratory Cough?: No Shortness of breath?: No  Endocrine Excessive thirst?: No  Musculoskeletal Back pain?: No Joint  pain?: No  Neurological Headaches?: No Dizziness?: No  Psychologic Depression?: No Anxiety?: No  Physical Exam: BP 132/79    Pulse (!) 137    Ht _0  (1.626 m)    Wt 120 lb (54.4 kg)    BMI 20.60 kg/m   Constitutional:  Alert and oriented, No acute distress.  Elderly, frail, wearing back brace, accompanied by daughter.  Able to answer most questions appropriately.  In wheelchair. HEENT: [place] AT, moist mucus membranes.  Trachea midline, no masses. Cardiovascular: No clubbing, cyanosis, or edema. Respiratory: Normal respiratory effort, no increased work of breathing. Skin: No rashes, bruises or suspicious lesions. Neurologic: Grossly intact, no focal deficits, moving all 4 extremities. Psychiatric: Normal mood and affect.  Laboratory Data: Lab Results  Component Value Date   WBC 7.0 08/21/2019   HGB 11.6 (L) 08/21/2019   HCT 36.6 08/21/2019   MCV 88.8 08/21/2019   PLT 249 08/21/2019    Lab Results  Component Value Date   CREATININE 0.53 08/21/2019     Lab Results  Component Value Date   HGBA1C 6.0 (H) 03/07/2014    Pertinent Imaging: CLINICAL DATA:  Abdominal pain. Unwitnessed fall from bed. Hysterectomy and cholecystectomy. History of melanoma.  EXAM: CT ABDOMEN AND PELVIS WITH CONTRAST  TECHNIQUE: Multidetector CT imaging of the abdomen and pelvis was performed using the standard protocol following bolus administration of intravenous contrast.  CONTRAST:  71m OMNIPAQUE IOHEXOL 300 MG/ML  SOLN  COMPARISON:  09/01/2009 abdominal ultrasound.  No comparison CT.  FINDINGS: Lower chest: Bibasilar scarring. Right middle lobe dominant bulla or bleb. Minimal motion degradation. Mild cardiomegaly with small left and trace right pleural effusions.  Hepatobiliary: High right hepatic lobe hemangioma at 6.4 x 4.0 cm on 22/2 left hepatic lobe cysts. Cholecystectomy, without biliary ductal dilatation.  Pancreas: No pancreatic duct dilatation or acute  inflammation. Suspected cystic lesion in the pancreatic tail of 3 mm on 32/2 is of doubtful clinical significance given patient age.  Spleen: Low-density subcentimeter splenic lesion is also of no clinical significance.  Adrenals/Urinary Tract: Mild left adrenal thickening. Normal right adrenal gland.  1.3 cm interpolar left renal lesion demonstrates postcontrast characteristics which are suspicious for renal cell carcinoma, including on 33/2.  A more posterior interpolar left renal 9 mm lesion on 34/2 demonstrates complexity along its lateral portion.  Upper pole left renal too small to characterize lesion.  Central right interpolar heterogeneous right renal mass measures 2.1 x 2.0 cm on 44/2 and 35/5. Also demonstrates "deenhancement" on delayed images, consistent with a solid mass. There also bilateral renal sinus cysts. No hydronephrosis. Normal urinary bladder.  Stomach/Bowel: Normal stomach, without wall thickening. Extensive colonic diverticulosis. Normal terminal ileum. Normal small bowel.  Vascular/Lymphatic: Advanced aortic and branch vessel atherosclerosis. Patent renal veins. No abdominopelvic adenopathy.  Reproductive: Hysterectomy.  No adnexal mass.  Other: No significant free fluid.  Moderate pelvic floor laxity.  Musculoskeletal: Osteopenia. Remote lower posterior left rib fractures. Acute mild to moderate L1 compression deformity with  mild ventral canal encroachment, including on sagittal image 63.  IMPRESSION: 1. Acute L1 compression deformity, with ventral canal encroachment. 2. Bilateral renal lesions. Left-sided lesion is likely renal cell carcinoma. The right-sided lesion is favored to represent a central renal cell carcinoma versus less likely urothelial carcinoma. There is also a smaller left-sided indeterminate renal lesion. If this patient is a treatment candidate, consider dedicated outpatient pre and post contrast abdominal MRI. 3.  No evidence of abdominal metastasis.  Patent renal veins. 4. Tiny left and trace right pleural effusions. 5. "Giant" right hepatic lobe hemangioma. 6. Pelvic floor laxity. 7.  Aortic Atherosclerosis (ICD10-I70.0).  These results were called by telephone at the time of interpretation on 08/16/2019 at 12:49 pm to Dr. Lenise Arena , who verbally acknowledged these results.   Electronically Signed   By: Abigail Miyamoto M.D.   On: 08/16/2019 12:51  CT abdomen with contrast was personally reviewed today.  Agree with radiologic interpretation.  Multifocal small bilateral renal lesions suspicious for renal cell carcinoma.    Assessment & Plan:    1.  Right renal mass x1 and left renal mass x2 We discussed and personally reviewed radiologic findings today Given her age and comorbidities, she is a very poor candidate for any intervention including surgery or even ablative therapy We discussed the natural history of small renal masses and the risk of metastatic disease, lesions less than 3 cm in retrospective studies have 5% or less chance of metastatic potential annually Growth rate of these lesions is unknown If strongly recommended continued surveillance, follow-up in 6 months with CT abdomen with and without contrast to assess growth rate If these grow dramatically during this time interval, could consider ablation versus continued surveillance - CT Abd Wo & W Cm; Future  2.  Abnormal urinalysis UA during inpatient admission was self collected with numerous squamous epithelials consistent with contaminated collection, do not suspect true UTI Deferred repeat urinalysis today given this and lack of symptoms She and her daughter agree with this plan  Return in about 6 months (around 03/10/2020) for CT scan.  Hollice Espy, MD  [facility] [address], [place], [place] [address] [phone]

## 2019-09-12 DIAGNOSIS — R140 Abdominal distension (gaseous): Secondary | ICD-10-CM | POA: Diagnosis not present

## 2019-09-12 DIAGNOSIS — R109 Unspecified abdominal pain: Secondary | ICD-10-CM | POA: Diagnosis not present

## 2019-09-17 DIAGNOSIS — S32010D Wedge compression fracture of first lumbar vertebra, subsequent encounter for fracture with routine healing: Secondary | ICD-10-CM | POA: Diagnosis not present

## 2019-09-17 DIAGNOSIS — R4189 Other symptoms and signs involving cognitive functions and awareness: Secondary | ICD-10-CM | POA: Diagnosis not present

## 2019-09-17 DIAGNOSIS — R630 Anorexia: Secondary | ICD-10-CM | POA: Diagnosis not present

## 2019-09-17 DIAGNOSIS — C642 Malignant neoplasm of left kidney, except renal pelvis: Secondary | ICD-10-CM | POA: Diagnosis not present

## 2019-09-19 ENCOUNTER — Other Ambulatory Visit: Payer: Self-pay

## 2019-09-19 ENCOUNTER — Inpatient Hospital Stay
Admission: EM | Admit: 2019-09-19 | Discharge: 2019-09-26 | DRG: 308 | Disposition: A | Discharge status: Hospice | Payer: PPO | Source: Skilled Nursing Facility | Attending: Internal Medicine | Admitting: Internal Medicine

## 2019-09-19 ENCOUNTER — Ambulatory Visit (INDEPENDENT_AMBULATORY_CARE_PROVIDER_SITE_OTHER): Payer: PPO | Admitting: Internal Medicine

## 2019-09-19 ENCOUNTER — Emergency Department: Payer: PPO

## 2019-09-19 ENCOUNTER — Encounter: Payer: Self-pay | Admitting: Internal Medicine

## 2019-09-19 DIAGNOSIS — F0390 Unspecified dementia without behavioral disturbance: Secondary | ICD-10-CM | POA: Diagnosis present

## 2019-09-19 DIAGNOSIS — M4856XA Collapsed vertebra, not elsewhere classified, lumbar region, initial encounter for fracture: Secondary | ICD-10-CM | POA: Diagnosis present

## 2019-09-19 DIAGNOSIS — R739 Hyperglycemia, unspecified: Secondary | ICD-10-CM | POA: Diagnosis not present

## 2019-09-19 DIAGNOSIS — N2889 Other specified disorders of kidney and ureter: Secondary | ICD-10-CM | POA: Diagnosis not present

## 2019-09-19 DIAGNOSIS — R000 Tachycardia, unspecified: Secondary | ICD-10-CM | POA: Diagnosis not present

## 2019-09-19 DIAGNOSIS — E7800 Pure hypercholesterolemia, unspecified: Secondary | ICD-10-CM | POA: Diagnosis not present

## 2019-09-19 DIAGNOSIS — R1310 Dysphagia, unspecified: Secondary | ICD-10-CM | POA: Diagnosis not present

## 2019-09-19 DIAGNOSIS — I4891 Unspecified atrial fibrillation: Principal | ICD-10-CM | POA: Diagnosis present

## 2019-09-19 DIAGNOSIS — Z66 Do not resuscitate: Secondary | ICD-10-CM | POA: Diagnosis not present

## 2019-09-19 DIAGNOSIS — Z515 Encounter for palliative care: Secondary | ICD-10-CM | POA: Diagnosis not present

## 2019-09-19 DIAGNOSIS — R531 Weakness: Secondary | ICD-10-CM

## 2019-09-19 DIAGNOSIS — S32010D Wedge compression fracture of first lumbar vertebra, subsequent encounter for fracture with routine healing: Secondary | ICD-10-CM

## 2019-09-19 DIAGNOSIS — W19XXXA Unspecified fall, initial encounter: Secondary | ICD-10-CM | POA: Diagnosis not present

## 2019-09-19 DIAGNOSIS — Z90710 Acquired absence of both cervix and uterus: Secondary | ICD-10-CM

## 2019-09-19 DIAGNOSIS — E538 Deficiency of other specified B group vitamins: Secondary | ICD-10-CM | POA: Diagnosis present

## 2019-09-19 DIAGNOSIS — R5381 Other malaise: Secondary | ICD-10-CM | POA: Diagnosis not present

## 2019-09-19 DIAGNOSIS — E785 Hyperlipidemia, unspecified: Secondary | ICD-10-CM | POA: Diagnosis present

## 2019-09-19 DIAGNOSIS — E876 Hypokalemia: Secondary | ICD-10-CM | POA: Diagnosis present

## 2019-09-19 DIAGNOSIS — Z7189 Other specified counseling: Secondary | ICD-10-CM | POA: Diagnosis not present

## 2019-09-19 DIAGNOSIS — I361 Nonrheumatic tricuspid (valve) insufficiency: Secondary | ICD-10-CM | POA: Diagnosis not present

## 2019-09-19 DIAGNOSIS — R0902 Hypoxemia: Secondary | ICD-10-CM | POA: Diagnosis not present

## 2019-09-19 DIAGNOSIS — Z7401 Bed confinement status: Secondary | ICD-10-CM | POA: Diagnosis not present

## 2019-09-19 DIAGNOSIS — Z85820 Personal history of malignant melanoma of skin: Secondary | ICD-10-CM

## 2019-09-19 DIAGNOSIS — S32010A Wedge compression fracture of first lumbar vertebra, initial encounter for closed fracture: Secondary | ICD-10-CM | POA: Diagnosis not present

## 2019-09-19 DIAGNOSIS — J690 Pneumonitis due to inhalation of food and vomit: Secondary | ICD-10-CM | POA: Diagnosis present

## 2019-09-19 DIAGNOSIS — M810 Age-related osteoporosis without current pathological fracture: Secondary | ICD-10-CM | POA: Diagnosis present

## 2019-09-19 DIAGNOSIS — J189 Pneumonia, unspecified organism: Secondary | ICD-10-CM

## 2019-09-19 DIAGNOSIS — E162 Hypoglycemia, unspecified: Secondary | ICD-10-CM | POA: Diagnosis not present

## 2019-09-19 DIAGNOSIS — I340 Nonrheumatic mitral (valve) insufficiency: Secondary | ICD-10-CM | POA: Diagnosis not present

## 2019-09-19 DIAGNOSIS — J90 Pleural effusion, not elsewhere classified: Secondary | ICD-10-CM | POA: Diagnosis not present

## 2019-09-19 DIAGNOSIS — R52 Pain, unspecified: Secondary | ICD-10-CM | POA: Diagnosis not present

## 2019-09-19 DIAGNOSIS — R4020 Unspecified coma: Secondary | ICD-10-CM | POA: Diagnosis not present

## 2019-09-19 DIAGNOSIS — M2550 Pain in unspecified joint: Secondary | ICD-10-CM | POA: Diagnosis not present

## 2019-09-19 DIAGNOSIS — J181 Lobar pneumonia, unspecified organism: Secondary | ICD-10-CM | POA: Diagnosis not present

## 2019-09-19 DIAGNOSIS — R404 Transient alteration of awareness: Secondary | ICD-10-CM | POA: Diagnosis not present

## 2019-09-19 DIAGNOSIS — Z20828 Contact with and (suspected) exposure to other viral communicable diseases: Secondary | ICD-10-CM | POA: Diagnosis present

## 2019-09-19 LAB — CBC WITH DIFFERENTIAL/PLATELET
Abs Immature Granulocytes: 0.09 10*3/uL — ABNORMAL HIGH (ref 0.00–0.07)
Basophils Absolute: 0 10*3/uL (ref 0.0–0.1)
Basophils Relative: 0 %
Eosinophils Absolute: 0.2 10*3/uL (ref 0.0–0.5)
Eosinophils Relative: 3 %
HCT: 43.4 % (ref 36.0–46.0)
Hemoglobin: 13.7 g/dL (ref 12.0–15.0)
Immature Granulocytes: 1 %
Lymphocytes Relative: 18 %
Lymphs Abs: 1.7 10*3/uL (ref 0.7–4.0)
MCH: 27.6 pg (ref 26.0–34.0)
MCHC: 31.6 g/dL (ref 30.0–36.0)
MCV: 87.5 fL (ref 80.0–100.0)
Monocytes Absolute: 0.9 10*3/uL (ref 0.1–1.0)
Monocytes Relative: 10 %
Neutro Abs: 6.4 10*3/uL (ref 1.7–7.7)
Neutrophils Relative %: 68 %
Platelets: 410 10*3/uL — ABNORMAL HIGH (ref 150–400)
RBC: 4.96 MIL/uL (ref 3.87–5.11)
RDW: 14.2 % (ref 11.5–15.5)
WBC: 9.4 10*3/uL (ref 4.0–10.5)
nRBC: 0 % (ref 0.0–0.2)

## 2019-09-19 LAB — COMPREHENSIVE METABOLIC PANEL
ALT: 15 U/L (ref 0–44)
AST: 19 U/L (ref 15–41)
Albumin: 3.4 g/dL — ABNORMAL LOW (ref 3.5–5.0)
Alkaline Phosphatase: 94 U/L (ref 38–126)
Anion gap: 12 (ref 5–15)
BUN: 11 mg/dL (ref 8–23)
CO2: 29 mmol/L (ref 22–32)
Calcium: 9 mg/dL (ref 8.9–10.3)
Chloride: 99 mmol/L (ref 98–111)
Creatinine, Ser: 0.44 mg/dL (ref 0.44–1.00)
GFR calc Af Amer: >60 mL/min (ref >60)
GFR calc non Af Amer: >60 mL/min (ref >60)
Glucose, Bld: 109 mg/dL — ABNORMAL HIGH (ref 70–99)
Potassium: 3.6 mmol/L (ref 3.5–5.1)
Sodium: 140 mmol/L (ref 135–145)
Total Bilirubin: 0.5 mg/dL (ref 0.3–1.2)
Total Protein: 6.4 g/dL — ABNORMAL LOW (ref 6.5–8.1)

## 2019-09-19 LAB — URINALYSIS, COMPLETE (UACMP) WITH MICROSCOPIC
Bacteria, UA: NONE SEEN
Bilirubin Urine: NEGATIVE
Glucose, UA: NEGATIVE mg/dL
Hgb urine dipstick: NEGATIVE
Ketones, ur: NEGATIVE mg/dL
Leukocytes,Ua: NEGATIVE
Nitrite: NEGATIVE
Protein, ur: NEGATIVE mg/dL
Specific Gravity, Urine: 1.016 (ref 1.005–1.030)
Squamous Epithelial / HPF: NONE SEEN (ref 0–5)
pH: 5 (ref 5.0–8.0)

## 2019-09-19 LAB — LACTIC ACID, PLASMA
Lactic Acid, Venous: 1.6 mmol/L (ref 0.5–1.9)
Lactic Acid, Venous: 0.9 mmol/L (ref 0.5–1.9)

## 2019-09-19 LAB — SARS CORONAVIRUS 2 BY RT PCR (HOSPITAL ORDER, PERFORMED IN ~~LOC~~ HOSPITAL LAB): SARS Coronavirus 2: NEGATIVE

## 2019-09-19 LAB — TROPONIN I (HIGH SENSITIVITY)
Troponin I (High Sensitivity): 10 ng/L (ref <18)
Troponin I (High Sensitivity): 10 ng/L (ref <18)

## 2019-09-19 LAB — GLUCOSE, CAPILLARY: Glucose-Capillary: 99 mg/dL (ref 70–99)

## 2019-09-19 MED ORDER — DILTIAZEM HCL 25 MG/5ML IV SOLN
5.0000 mg | Freq: Once | INTRAVENOUS | Status: AC
  Administered 2019-09-19: 5 mg via INTRAVENOUS
  Filled 2019-09-19: qty 5

## 2019-09-19 MED ORDER — SODIUM CHLORIDE 0.9 % IV SOLN: 2.0000 g | Freq: Two times a day (BID) | INTRAVENOUS | Status: DC

## 2019-09-19 MED ORDER — VANCOMYCIN HCL IN DEXTROSE 1-5 GM/200ML-% IV SOLN
1000.0000 mg | Freq: Once | INTRAVENOUS | Status: AC
  Administered 2019-09-19: 1000 mg via INTRAVENOUS
  Filled 2019-09-19: qty 200

## 2019-09-19 MED ORDER — SODIUM CHLORIDE 0.9 % IV SOLN
2.0000 g | Freq: Once | INTRAVENOUS | Status: AC
  Administered 2019-09-19: 2 g via INTRAVENOUS
  Filled 2019-09-19: qty 2

## 2019-09-19 MED ORDER — DILTIAZEM HCL 100 MG IV SOLR
5.0000 mg/h | INTRAVENOUS | Status: DC
  Administered 2019-09-19 – 2019-09-23 (×5): 5 mg/h via INTRAVENOUS
  Filled 2019-09-19 (×6): qty 100

## 2019-09-19 MED ORDER — VANCOMYCIN HCL IN DEXTROSE 1-5 GM/200ML-% IV SOLN: 1000.0000 mg | INTRAVENOUS | Status: DC

## 2019-09-19 MED ORDER — SODIUM CHLORIDE 0.9 % IV SOLN: 2.0000 g | Freq: Three times a day (TID) | INTRAVENOUS | Status: DC

## 2019-09-19 MED ORDER — SODIUM CHLORIDE 0.9 % IV SOLN
2.0000 g | Freq: Two times a day (BID) | INTRAVENOUS | Status: DC
  Administered 2019-09-20 – 2019-09-22 (×5): 2 g via INTRAVENOUS
  Filled 2019-09-19 (×6): qty 2

## 2019-09-19 MED ORDER — SODIUM CHLORIDE 0.9 % IV BOLUS
500.0000 mL | Freq: Once | INTRAVENOUS | Status: AC
  Administered 2019-09-19: 500 mL via INTRAVENOUS

## 2019-09-19 NOTE — H&P (Signed)
[facility] NAME: Denise Garrison    MR#:  [id number]  DATE OF BIRTH:  [birth year]  DATE OF ADMISSION:  09/19/2019  PRIMARY CARE PHYSICIAN: Einar Pheasant, MD   REQUESTING/REFERRING PHYSICIAN: Arta Silence, MD  CHIEF COMPLAINT:   Chief Complaint  Patient presents with  . Weakness    HISTORY OF PRESENT ILLNESS: Denise Garrison  is a 85 y.o. female with a known history of B12 deficiency, hyperlipidemia who was brought to the hospital with generalized weakness.  Patient was recently at a rehab facility and sent to assisted living.  According to the daughter who is at bedside states that her mother when she saw her 2 days ago could not walk and has not been acting herself.  Patient was brought to the emergency room noted to have A. fib with RVR as well as pneumonia.  Her daughter reports that patient has been coughing. Patient did recently have L1 fracture.    PAST MEDICAL HISTORY:   Past Medical History:  Diagnosis Date  . B12 deficiency   . Cancer [facility])    history of melanoma, s/p excision x 3  . Cavernous hemangioma    History of  . Hiatal hernia    History of  . Hx of detached retina repair   . Hypercholesterolemia   . Osteoporosis     PAST SURGICAL HISTORY:  Past Surgical History:  Procedure Laterality Date  . ABDOMINAL HYSTERECTOMY  1975   Secondary to fibroid tumors  . BREAST BIOPSY     Benign  . CHOLECYSTECTOMY  4/91  . EYE SURGERY     Bilateral Cataract   . SKIN CANCER EXCISION     Malignant Melanoma x3    SOCIAL HISTORY:  Social History   Tobacco Use  . Smoking status: Never Smoker  . Smokeless tobacco: Never Used  Substance Use Topics  . Alcohol use: Yes    Comment: occasional wine, rare    FAMILY HISTORY:  Family History  Problem Relation Age of Onset  . Kidney disease Father   . Heart disease Father   . Kidney disease Brother   . Parkinson's disease Brother   . Melanoma Brother   .  Breast cancer Neg Hx   . Colon cancer Neg Hx     DRUG ALLERGIES: No Known Allergies  REVIEW OF SYSTEMS:   CONSTITUTIONAL:  Patient confused unable to provide any review of system  MEDICATIONS AT HOME:  Prior to Admission medications   Medication Sig Start Date End Date Taking? Authorizing Provider  Cholecalciferol (D 5000) 125 MCG (5000 UT) capsule Take 5,000 Units by mouth daily.    Yes [provider]  cyanocobalamin (,VITAMIN B-12,) 1000 MCG/ML injection Inject 1,000 mcg into the muscle every 30 (thirty) days.   Yes [provider]  acetaminophen (TYLENOL) 325 MG tablet Take 2 tablets (650 mg total) by mouth every 6 (six) hours as needed for mild pain (or Fever >/= 101). Patient not taking: Reported on 09/19/2019 08/21/19   Dustin Flock, MD  docusate sodium (COLACE) 100 MG capsule Take 1 capsule (100 mg total) by mouth 2 (two) times daily. Patient not taking: Reported on 09/19/2019 08/21/19   Dustin Flock, MD      PHYSICAL EXAMINATION:   VITAL SIGNS: Blood pressure 118/69, pulse (!) 113, temperature 98.3 F (36.8 C), resp. rate (!) 26, height 5\' 4"  (1.626 m), weight 54.4 kg, SpO2 93 %.  GENERAL:  85 y.o.-year-old patient lying  in the bed frail looking female EYES: Pupils equal, round, reactive to light and accommodation. No scleral icterus. Extraocular muscles intact.  HEENT: Head atraumatic, normocephalic. Oropharynx and nasopharynx clear.  NECK:  Supple, no jugular venous distention. No thyroid enlargement, no tenderness.  LUNGS: Normal breath sounds bilaterally, no wheezing, rales,rhonchi or crepitation. No use of accessory muscles of respiration.  CARDIOVASCULAR: Irregularly irregular no murmurs, rubs, or gallops.  ABDOMEN: Soft, nontender, nondistended. Bowel sounds present. No organomegaly or mass.  EXTREMITIES: No pedal edema, cyanosis, or clubbing.  NEUROLOGIC: Limited due to patient not able to follow commands but is awake PSYCHIATRIC: The patient is  alert but not oriented SKIN: No obvious rash, lesion, or ulcer.   LABORATORY PANEL:   CBC Recent Labs  Lab 09/19/19 1722  WBC 9.4  HGB 13.7  HCT 43.4  PLT 410*  MCV 87.5  MCH 27.6  MCHC 31.6  RDW 14.2  LYMPHSABS 1.7  MONOABS 0.9  EOSABS 0.2  BASOSABS 0.0   ------------------------------------------------------------------------------------------------------------------  Chemistries  Recent Labs  Lab 09/19/19 1722  NA 140  K 3.6  CL 99  CO2 29  GLUCOSE 109*  BUN 11  CREATININE 0.44  CALCIUM 9.0  AST 19  ALT 15  ALKPHOS 94  BILITOT 0.5   ------------------------------------------------------------------------------------------------------------------ estimated creatinine clearance is 36.9 mL/min (by C-G formula based on SCr of 0.44 mg/dL). ------------------------------------------------------------------------------------------------------------------ No results for input(s): TSH, T4TOTAL, T3FREE, THYROIDAB in the last 72 hours.  Invalid input(s): FREET3   Coagulation profile No results for input(s): INR, PROTIME in the last 168 hours. ------------------------------------------------------------------------------------------------------------------- No results for input(s): DDIMER in the last 72 hours. -------------------------------------------------------------------------------------------------------------------  Cardiac Enzymes No results for input(s): CKMB, TROPONINI, MYOGLOBIN in the last 168 hours.  Invalid input(s): CK ------------------------------------------------------------------------------------------------------------------ Invalid input(s): POCBNP  ---------------------------------------------------------------------------------------------------------------  Urinalysis    Component Value Date/Time   COLORURINE YELLOW (A) 09/19/2019 1815   APPEARANCEUR CLEAR (A) 09/19/2019 1815   LABSPEC 1.016 09/19/2019 1815   PHURINE 5.0  09/19/2019 1815   GLUCOSEU NEGATIVE 09/19/2019 1815   HGBUR NEGATIVE 09/19/2019 1815   BILIRUBINUR NEGATIVE 09/19/2019 1815   BILIRUBINUR neg 09/08/2014 1030   KETONESUR NEGATIVE 09/19/2019 1815   PROTEINUR NEGATIVE 09/19/2019 1815   UROBILINOGEN negative 09/08/2014 1030   NITRITE NEGATIVE 09/19/2019 1815   LEUKOCYTESUR NEGATIVE 09/19/2019 1815     RADIOLOGY: Dg Chest Portable 1 View  Result Date: 09/19/2019 CLINICAL DATA:  Patient with history of back fracture. EXAM: PORTABLE CHEST 1 VIEW COMPARISON:  Rib series 03/07/2009 FINDINGS: Cardiac contours upper limits of normal. Small bilateral pleural effusions, right-greater-than-left. Right basilar consolidation. Biapical pleuroparenchymal thickening left greater than right. Thoracic spine degenerative changes. Monitoring leads overlie the patient. IMPRESSION: Patchy consolidation within the right lower lung with right-greater-than-left pleural effusions. Opacities may represent atelectasis or infection. Electronically Signed   By: Lovey Newcomer M.D.   On: 09/19/2019 18:37    EKG: Orders placed or performed during the hospital encounter of 09/19/19  . ED EKG  . ED EKG  . EKG 12-Lead  . EKG 12-Lead  . EKG 12-Lead  . EKG 12-Lead    IMPRESSION AND PLAN: Patient is a 85 year old white female presenting with generalized weakness not communicating much  1.  HCAP we will treat with vancomycin and Rocephin check a procalcitonin level also concerned about aspiration I will have speech see the patient in the morning  2.  A. fib with RVR which is new for this patient we will check TSH I will place her on IV Cardizem  we will place her on low-dose aspirin.  She is not a good candidate for anticoagulation due to fall risk We will also check echocardiogram of the heart If her heart rate is not controlled consider cardiology input  3.  Recent L1 fracture continue brace as prescribed  4.  Miscellaneous Lovenox for DVT prophylaxis   All the  records are reviewed and case discussed with ED provider. Management plans discussed with the patient, family and they are in agreement.  CODE STATUS: Code Status History    Date Active Date Inactive Code Status Order ID Comments User Context   08/20/2019 1256 08/21/2019 1857 DNR [id number]  Dustin Flock, MD Inpatient   08/18/2019 2143 08/20/2019 1256 Full Code [id number]  Lance Coon, MD Inpatient   Advance Care Planning Activity    Questions for Most Recent Historical Code Status (Order [id number])    Question Answer Comment   In the event of cardiac or respiratory ARREST Do not call a "code blue"    In the event of cardiac or respiratory ARREST Do not perform Intubation, CPR, defibrillation or ACLS    In the event of cardiac or respiratory ARREST Use medication by any route, position, wound care, and other measures to relive pain and suffering. May use oxygen, suction and manual treatment of airway obstruction as needed for comfort.        TOTAL TIME TAKING CARE OF THIS PATIENT: 55 minutes.    Dustin Flock M.D on 09/19/2019 at 8:31 PM  Between 7am to 6pm - Pager - [phone]  After 6pm go to [email or web address] - password [email or web address]  [employer] [facility] Office  [phone]  CC: Primary care physician; Einar Pheasant, MD

## 2019-09-19 NOTE — Progress Notes (Signed)
Advanced care plan.  Purpose of the Encounter: CODE STATUS  Parties in Attendance: Patient's daughter and herself Patient's Decision Capacity: Not intact  Subjective/Patient's story: Denise Garrison  is a 85 y.o. female with a known history of B12 deficiency, hyperlipidemia who was brought to the hospital with generalized weakness.  Patient noticed to have A. fib with RVR.  And also pneumonia.   Objective/Medical story  I discussed with the patient's daughter regarding her mother's wishes for CODE STATUS.  She states she her mother does not want to be intubated or resuscitated  Goals of care determination:  DNR   CODE STATUS: DNR   Time spent discussing advanced care planning: 16 minutes

## 2019-09-19 NOTE — Progress Notes (Signed)
Patient ID: Denise Garrison, female   DOB: January 04, 1923, 85 y.o.   MRN: [id number]   Virtual Visit via video Note  This visit type was conducted due to national recommendations for restrictions regarding the COVID-19 pandemic (e.g. social distancing).  This format is felt to be most appropriate for this patient at this time.  All issues noted in this document were discussed and addressed.  No physical exam was performed (except for noted visual exam findings with Video Visits).   I connected with Zayna Jaskolski by a video enabled telemedicine application and verified that I am speaking with the correct person using two identifiers. Location patient: home Location provider: work  Persons participating in the virtual visit: patient, provider and pts daughter  I discussed the limitations, risks, security and privacy concerns of performing an evaluation and management service by video and the availability of in person appointments.  The patient expressed understanding and agreed to proceed.   Reason for visit: hospital/rehab follow up.   HPI: Pt recently admitted 08/16/19 - after a fall.  Had back pain.  Found to have L1 compression fracture.  Has been evaluated by [facility].  Recommended brace and rehab.  Discharged to [facility].  Also found to have renal mass - presumed renal cell cancer.  Seeing Dr Erlene Quan.  Just evaluated 09/11/19.  Recommended 6 month f/u CT.  She was just discharged from [facility] Tuesday 09/17/19.  Discharged back to [facility] - to her apartment.  Daughter reports pt is unable to stand on her own.  Not able to ambulate.  She has to feed her.  Unable to do her ADLs.  Daughter has been trying to stay with her until she can figure out a plan, but had to leave to go to her house and while gone, pt fell again.  No known injury.  Denied hitting her head.  [facility] had a nurse evaluate her and they felt she needed 24/7 care.  Unable to get this at her current facility.  Daughter had  questions about care and transfer of care.     ROS: See pertinent positives and negatives per HPI.  Past Medical History:  Diagnosis Date   B12 deficiency    Cancer ([place])    history of melanoma, s/p excision x 3   Cavernous hemangioma    History of   Hiatal hernia    History of   Hx of detached retina repair    Hypercholesterolemia    Osteoporosis     Past Surgical History:  Procedure Laterality Date   ABDOMINAL HYSTERECTOMY  1975   Secondary to fibroid tumors   BREAST BIOPSY     Benign   CHOLECYSTECTOMY  4/91   EYE SURGERY     Bilateral Cataract    SKIN CANCER EXCISION     Malignant Melanoma x3    Family History  Problem Relation Age of Onset   Kidney disease Father    Heart disease Father    Kidney disease Brother    Parkinson's disease Brother    Melanoma Brother    Breast cancer Neg Hx    Colon cancer Neg Hx     SOCIAL HX: reviewed.    Current Outpatient Medications:    acetaminophen (TYLENOL) 325 MG tablet, Take 2 tablets (650 mg total) by mouth every 6 (six) hours as needed for mild pain (or Fever >/= 101)., Disp:  , Rfl:    Cholecalciferol (D 5000) 125 MCG (5000 UT) capsule, Take by  mouth., Disp: , Rfl:    Cyanocobalamin (B-12 COMPLIANCE INJECTION) 1000 MCG/ML KIT, Inject 1,000 mcg as directed every 30 (thirty) days., Disp: , Rfl:    docusate sodium (COLACE) 100 MG capsule, Take 1 capsule (100 mg total) by mouth 2 (two) times daily., Disp: 10 capsule, Rfl: 0  EXAM:  GENERAL: alert.  Answering some questions.    HEENT: atraumatic, conjunttiva clear, no obvious abnormalities on inspection of external nose and ears  NECK: normal movements of the head and neck  LUNGS: on inspection no signs of respiratory distress, breathing rate appears normal, no obvious gross SOB, gasping or wheezing  CV: no obvious cyanosis  PSYCH/NEURO: pleasant and cooperative  ASSESSMENT AND PLAN:  Discussed the following assessment and  plan:  Closed compression fracture of L1 vertebra (HCC) S/p fall with compression fracture.  Admitted to rehab.  Now at [facility].  Unable to walk, stand or transfer on her own.  Needs full assistance with ADLs.  Unable to feed herself, etc.  Discussed with daughter.  Discussed home health. Daughter concerned about pt being by herself. This is a change in her clinical and physical - status.  Discussed with daughter that I would discuss with director regarding our options for possible transfer to a skilled facility.  Per nurse evaluation of pt,  her recommendation is that pt needs 24/7 care. It appears she needs a skilled level of care.    Bilateral renal masses Being followed by urology - recommended f/u CT 6 months.    A-fib ([place]) Has declined anticoagulation and further w/up.      I discussed the assessment and treatment plan with the patient. The patient was provided an opportunity to ask questions and all were answered. The patient agreed with the plan and demonstrated an understanding of the instructions.   The patient was advised to call back or seek an in-person evaluation if the symptoms worsen or if the condition fails to improve as anticipated.   Einar Pheasant, MD

## 2019-09-19 NOTE — Consult Note (Signed)
PHARMACY -  BRIEF ANTIBIOTIC NOTE   Pharmacy has received consult(s) for Cefepime and Vancomycin from an ED provider.  The patient's profile has been reviewed for ht/wt/allergies/indication/available labs.    One time order(s) placed for Vancomycin 1g  and Cefepime 2g   Further antibiotics/pharmacy consults should be ordered by admitting physician if indicated.                       Thank you, Pernell Dupre, PharmD, BCPS Clinical Pharmacist 09/19/2019 7:09 PM

## 2019-09-19 NOTE — ED Notes (Signed)
Pt SPO2 noted at 91%. Pt placed on 2L via nasal cannula, O2 sat improved to 97%

## 2019-09-19 NOTE — ED Provider Notes (Signed)
[facility] Emergency Department Provider Note ____________________________________________   First MD Initiated Contact with Patient 09/19/19 1732     (approximate)  I have reviewed the triage vital signs and the nursing notes.   HISTORY  Chief Complaint Weakness  Level 5 caveat: History present illness limited due to poor historian  HPI Denise Garrison is a 85 y.o. female with PMH as noted below including recent L1 fracture who presents to the ED essentially for inability to take care of herself adequately with her current level of care.  Per EMS and paperwork provided with the patient, she was discharged from [facility] rehabilitation facility 2 days ago to [facility] which is an independent living facility.  To be able to live at [facility], she would need to be able to complete her ADLs independently, however the patient has been unable to do so over these last 2 days and requires assistance with ambulation even with a walker.  She also has apparently had an unwitnessed fall a few days ago.  The patient herself has no acute complaints except for a cough.  She denies any shortness of breath, weakness, fever, or acute pain.  Past Medical History:  Diagnosis Date   B12 deficiency    Cancer [facility])    history of melanoma, s/p excision x 3   Cavernous hemangioma    History of   Hiatal hernia    History of   Hx of detached retina repair    Hypercholesterolemia    Osteoporosis     Patient Active Problem List   Diagnosis Date Noted   UTI (urinary tract infection) 08/18/2019   Bilateral renal masses 08/18/2019   Closed compression fracture of L1 vertebra ([place]) 08/18/2019   Vitamin D deficiency 07/05/2017   A-fib ([place]) 01/08/2017   Thyroid fullness 03/09/2014   Hypercholesterolemia 11/11/2012   Osteoporosis 11/11/2012   B12 deficiency 11/11/2012   Cognitive dysfunction 11/11/2012    Past Surgical History:  Procedure Laterality  Date   ABDOMINAL HYSTERECTOMY  1975   Secondary to fibroid tumors   BREAST BIOPSY     Benign   CHOLECYSTECTOMY  4/91   EYE SURGERY     Bilateral Cataract    SKIN CANCER EXCISION     Malignant Melanoma x3    Prior to Admission medications   Medication Sig Start Date End Date Taking? Authorizing Provider  Cholecalciferol (D 5000) 125 MCG (5000 UT) capsule Take 5,000 Units by mouth daily.    Yes [provider]  cyanocobalamin (,VITAMIN B-12,) 1000 MCG/ML injection Inject 1,000 mcg into the muscle every 30 (thirty) days.   Yes [provider]  acetaminophen (TYLENOL) 325 MG tablet Take 2 tablets (650 mg total) by mouth every 6 (six) hours as needed for mild pain (or Fever >/= 101). Patient not taking: Reported on 09/19/2019 08/21/19   Dustin Flock, MD  docusate sodium (COLACE) 100 MG capsule Take 1 capsule (100 mg total) by mouth 2 (two) times daily. Patient not taking: Reported on 09/19/2019 08/21/19   Dustin Flock, MD    Allergies Patient has no known allergies.  Family History  Problem Relation Age of Onset   Kidney disease Father    Heart disease Father    Kidney disease Brother    Parkinson's disease Brother    Melanoma Brother    Breast cancer Neg Hx    Colon cancer Neg Hx     Social History Social History   Tobacco Use  Smoking status: Never Smoker   Smokeless tobacco: Never Used  Substance Use Topics   Alcohol use: Yes    Comment: occasional wine, rare   Drug use: No    Review of Systems Level 5 caveat: Review of systems limited due to poor historian Constitutional: No fever. Cardiovascular: Denies chest pain. Respiratory: Denies shortness of breath. Gastrointestinal: No vomiting. Skin: Negative for rash. Neurological: Negative for headache.   ____________________________________________   PHYSICAL EXAM:  VITAL SIGNS: ED Triage Vitals  Enc Vitals Group     BP 09/19/19 1723 125/74     Pulse Rate 09/19/19 1723  (!) 127     Resp 09/19/19 1723 (!) 25     Temp 09/19/19 1723 98.3 F (36.8 C)     Temp src --      SpO2 09/19/19 1723 94 %     Weight 09/19/19 1715 120 lb (54.4 kg)     Height 09/19/19 1715 5\' 4"  (1.626 m)     Head Circumference --      Peak Flow --      Pain Score --      Pain Loc --      Pain Edu? --      Excl. in [place]? --     Constitutional: Alert and oriented.  Weak and frail appearing but in no acute distress. Eyes: Conjunctivae are normal.  EOMI.  PERRLA. Head: Atraumatic. Nose: No congestion/rhinnorhea. Mouth/Throat: Mucous membranes are somewhat dry.   Neck: Normal range of motion.  Cardiovascular: Tachycardic, regular rhythm. Grossly normal heart sounds.  Good peripheral circulation. Respiratory: Normal respiratory effort.  No retractions. Lungs CTAB. Gastrointestinal: Soft and nontender. No distention.  Genitourinary: No flank tenderness. Musculoskeletal: No lower extremity edema.  Extremities warm and well perfused.  Neurologic:  Normal speech and language.  Motor intact in all extremities.  No gross focal neurologic deficits are appreciated.  Skin:  Skin is warm and dry. No rash noted. Psychiatric: Calm and cooperative.  ____________________________________________   LABS (all labs ordered are listed, but only abnormal results are displayed)  Labs Reviewed  CBC WITH DIFFERENTIAL/PLATELET - Abnormal; Notable for the following components:      Result Value   Platelets 410 (*)    Abs Immature Granulocytes 0.09 (*)    All other components within normal limits  COMPREHENSIVE METABOLIC PANEL - Abnormal; Notable for the following components:   Glucose, Bld 109 (*)    Total Protein 6.4 (*)    Albumin 3.4 (*)    All other components within normal limits  URINALYSIS, COMPLETE (UACMP) WITH MICROSCOPIC - Abnormal; Notable for the following components:   Color, Urine YELLOW (*)    APPearance CLEAR (*)    All other components within normal limits  SARS CORONAVIRUS 2 BY  RT PCR (HOSPITAL ORDER, [place] [facility])  GLUCOSE, CAPILLARY  LACTIC ACID, PLASMA  LACTIC ACID, PLASMA  TROPONIN I (HIGH SENSITIVITY)  TROPONIN I (HIGH SENSITIVITY)   ____________________________________________  EKG  ED ECG REPORT I, Arta Silence, the attending physician, personally viewed and interpreted this ECG.  Date: 09/19/2019 EKG Time: 1724 Rate: 127 Rhythm: Atrial fibrillation with RVR QRS Axis: normal Intervals: normal ST/T Wave abnormalities: Nonspecific ST abnormalities Narrative Interpretation: Atrial fibrillation with no evidence of acute ischemia  ____________________________________________  RADIOLOGY  CXR: Patchy consolidation in the right lower lung  ____________________________________________   PROCEDURES  Procedure(s) performed: No  Procedures  Critical Care performed: No ____________________________________________   INITIAL IMPRESSION / ASSESSMENT AND PLAN /  ED COURSE  Pertinent labs & imaging results that were available during my care of the patient were reviewed by me and considered in my medical decision making (see chart for details).  85 year old female with PMH as noted above presents from her facility because she was discharged from rehab to an independent living facility but cannot perform her ADLs.  However, on arrival to the ED the patient was found to be tachycardic and has been actively coughing.  O2 saturation is in the mid to low 90s on room air.  She is also somewhat tachypneic.  The remainder of the exam is as described above.  Although the patient will need social work evaluation to arrange for appropriate disposition, given her acute symptoms she will need ED work-up.  Differential includes pneumonia, bronchitis, COVID-19 or other viral syndrome, or less likely cardiac etiology.  We will obtain lab work-up, give fluids, and reassess.  ----------------------------------------- 8:52 PM on  09/19/2019 -----------------------------------------  The patient was still tachycardic in rapid A. fib after fluids so I gave a dose of diltiazem.  The daughter is now here and explains that the patient was able to walk with a walker and complete her other ADLs the day she was discharged from the rehab, and appears to have had a rapid decline over the last few days.  Therefore I added on a CT head to rule out acute stroke, although the patient does not have any focal neurologic findings at this time.  Work-up reveals findings consistent with likely pneumonia.  I ordered antibiotics to cover for HCAP.  I signed the patient out to the hospitalist Dr. Posey Pronto for admission.  _______________________  Denise Garrison was evaluated in Emergency Department on 09/19/2019 for the symptoms described in the history of present illness. She was evaluated in the context of the global COVID-19 pandemic, which necessitated consideration that the patient might be at risk for infection with the SARS-CoV-2 virus that causes COVID-19. Institutional protocols and algorithms that pertain to the evaluation of patients at risk for COVID-19 are in a state of rapid change based on information released by regulatory bodies including the CDC and federal and state organizations. These policies and algorithms were followed during the patient's care in the ED.  ____________________________________________   FINAL CLINICAL IMPRESSION(S) / ED DIAGNOSES  Final diagnoses:  Healthcare-associated pneumonia  Weakness      NEW MEDICATIONS STARTED DURING THIS VISIT:  New Prescriptions   No medications on file     Note:  This document was prepared using Dragon voice recognition software and may include unintentional dictation errors.    Arta Silence, MD 09/19/19 2054

## 2019-09-19 NOTE — Assessment & Plan Note (Signed)
Being followed by urology - recommended f/u CT 6 months.

## 2019-09-19 NOTE — Assessment & Plan Note (Signed)
S/p fall with compression fracture.  Admitted to rehab.  Now at [facility].  Unable to walk, stand or transfer on her own.  Needs full assistance with ADLs.  Unable to feed herself, etc.  Discussed with daughter.  Discussed home health. Daughter concerned about pt being by herself. This is a change in her clinical and physical - status.  Discussed with daughter that I would discuss with director regarding our options for possible transfer to a skilled facility.  Per nurse evaluation of pt,  her recommendation is that pt needs 24/7 care. It appears she needs a skilled level of care.

## 2019-09-19 NOTE — Assessment & Plan Note (Signed)
Has declined anticoagulation and further w/up.

## 2019-09-19 NOTE — ED Notes (Addendum)
ED TO INPATIENT HANDOFF REPORT  ED Nurse Name and Phone #:   Gershon Mussel RN  [phone]  S Name/Age/Gender Denise Garrison 85 y.o. female Room/Bed: ED05A/ED05A  Code Status   Code Status: Prior  Home/SNF/Other Home Patient oriented to: self, place, time and situation Is this baseline? Yes   Triage Complete: Triage complete  Chief Complaint Weakness  Triage Note Pt here via [facility] from [facility].  Per EMS- patient has a back fracture and was discharged from [facility] two days ago, facility said that patient has been unable to care for her self since returning. Pt had unwitnessed fall two days ago.  Pt denies pain at this time.   Ems VS- BP 135/79, T-98.3, HR 120, 95% RA.   Allergies No Known Allergies  Level of Care/Admitting Diagnosis ED Disposition    ED Disposition Condition [facility] Area: [place] [[id number]]  Level of Care: Telemetry [5]  Covid Evaluation: Asymptomatic Screening Protocol (No Symptoms)  Diagnosis: A-fib [facility]) [id number]  Admitting Physician: Dustin Flock [id number]  Attending Physician: Dustin Flock [phone]  Estimated length of stay: past midnight tomorrow  Certification:: I certify this patient will need inpatient services for at least 2 midnights  PT Class (Do Not Modify): Inpatient [101]  PT Acc Code (Do Not Modify): Private [1]       B Medical/Surgery History Past Medical History:  Diagnosis Date  . B12 deficiency   . Cancer [facility])    history of melanoma, s/p excision x 3  . Cavernous hemangioma    History of  . Hiatal hernia    History of  . Hx of detached retina repair   . Hypercholesterolemia   . Osteoporosis    Past Surgical History:  Procedure Laterality Date  . ABDOMINAL HYSTERECTOMY  1975   Secondary to fibroid tumors  . BREAST BIOPSY     Benign  . CHOLECYSTECTOMY  4/91  . EYE SURGERY     Bilateral Cataract   . SKIN CANCER EXCISION     Malignant Melanoma x3     A IV  Location/Drains/Wounds Patient Lines/Drains/Airways Status   Active Line/Drains/Airways    Name:   Placement date:   Placement time:   Site:   Days:   Peripheral IV 09/19/19 Right Forearm   09/19/19    1728    Forearm   less than 1   Peripheral IV 09/19/19 Left Forearm   09/19/19    1733    Forearm   less than 1   External Urinary Catheter   08/18/19    2230    -   32          Intake/Output Last 24 hours  Intake/Output Summary (Last 24 hours) at 09/19/2019 2136 Last data filed at 09/19/2019 2011 Gross per 24 hour  Intake 99.84 ml  Output -  Net 99.84 ml    Labs/Imaging Results for orders placed or performed during the hospital encounter of 09/19/19 (from the past 48 hour(s))  Glucose, capillary     Status: None   Collection Time: 09/19/19  5:12 PM  Result Value Ref Range   Glucose-Capillary 99 70 - 99 mg/dL  CBC with Differential     Status: Abnormal   Collection Time: 09/19/19  5:22 PM  Result Value Ref Range   WBC 9.4 4.0 - 10.5 K/uL   RBC 4.96 3.87 - 5.11 MIL/uL   Hemoglobin 13.7 12.0 - 15.0 g/dL   HCT 43.4 36.0 -  46.0 %   MCV 87.5 80.0 - 100.0 fL   MCH 27.6 26.0 - 34.0 pg   MCHC 31.6 30.0 - 36.0 g/dL   RDW 14.2 11.5 - 15.5 %   Platelets 410 (H) 150 - 400 K/uL   nRBC 0.0 0.0 - 0.2 %   Neutrophils Relative % 68 %   Neutro Abs 6.4 1.7 - 7.7 K/uL   Lymphocytes Relative 18 %   Lymphs Abs 1.7 0.7 - 4.0 K/uL   Monocytes Relative 10 %   Monocytes Absolute 0.9 0.1 - 1.0 K/uL   Eosinophils Relative 3 %   Eosinophils Absolute 0.2 0.0 - 0.5 K/uL   Basophils Relative 0 %   Basophils Absolute 0.0 0.0 - 0.1 K/uL   Immature Granulocytes 1 %   Abs Immature Granulocytes 0.09 (H) 0.00 - 0.07 K/uL    Comment: Performed at [facility], [address] [place], [place] [address]  Comprehensive metabolic panel     Status: Abnormal   Collection Time: 09/19/19  5:22 PM  Result Value Ref Range   Sodium 140 135 - 145 mmol/L   Potassium 3.6 3.5 - 5.1 mmol/L   Chloride 99  98 - 111 mmol/L   CO2 29 22 - 32 mmol/L   Glucose, Bld 109 (H) 70 - 99 mg/dL   BUN 11 8 - 23 mg/dL   Creatinine, Ser 0.44 0.44 - 1.00 mg/dL   Calcium 9.0 8.9 - 10.3 mg/dL   Total Protein 6.4 (L) 6.5 - 8.1 g/dL   Albumin 3.4 (L) 3.5 - 5.0 g/dL   AST 19 15 - 41 U/L   ALT 15 0 - 44 U/L   Alkaline Phosphatase 94 38 - 126 U/L   Total Bilirubin 0.5 0.3 - 1.2 mg/dL   GFR calc non Af Amer >60 >60 mL/min   GFR calc Af Amer >60 >60 mL/min   Anion gap 12 5 - 15    Comment: Performed at [facility], [address] [place], [place] [address]  Lactic acid, plasma     Status: None   Collection Time: 09/19/19  5:22 PM  Result Value Ref Range   Lactic Acid, Venous 1.6 0.5 - 1.9 mmol/L    Comment: Performed at [facility], [address], [place], [place] [address]  Troponin I (High Sensitivity)     Status: None   Collection Time: 09/19/19  5:22 PM  Result Value Ref Range   Troponin I (High Sensitivity) 10 <18 ng/L    Comment: (NOTE) Elevated high sensitivity troponin I (hsTnI) values and significant  changes across serial measurements may suggest ACS but many other  chronic and acute conditions are known to elevate hsTnI results.  Refer to the "Links" section for chest pain algorithms and additional  guidance. Performed at [facility], [address] [place],  [address]   Urinalysis, Complete w Microscopic     Status: Abnormal   Collection Time: 09/19/19  6:15 PM  Result Value Ref Range   Color, Urine YELLOW (A) YELLOW   APPearance CLEAR (A) CLEAR   Specific Gravity, Urine 1.016 1.005 - 1.030   pH 5.0 5.0 - 8.0   Glucose, UA NEGATIVE NEGATIVE mg/dL   Hgb urine dipstick NEGATIVE NEGATIVE   Bilirubin Urine NEGATIVE NEGATIVE   Ketones, ur NEGATIVE NEGATIVE mg/dL   Protein, ur NEGATIVE NEGATIVE mg/dL   Nitrite NEGATIVE NEGATIVE   Leukocytes,Ua NEGATIVE NEGATIVE   RBC / HPF 0-5 0 - 5 RBC/hpf  WBC, UA 0-5 0 - 5 WBC/hpf   Bacteria, UA NONE SEEN  NONE SEEN   Squamous Epithelial / LPF NONE SEEN 0 - 5   Mucus PRESENT     Comment: Performed at [facility], [address] [place], [place] [address]  SARS Coronavirus 2 by RT PCR (hospital order, performed in [facility] hospital lab) Nasopharyngeal Nasopharyngeal Swab     Status: None   Collection Time: 09/19/19  6:16 PM   Specimen: Nasopharyngeal Swab  Result Value Ref Range   SARS Coronavirus 2 NEGATIVE NEGATIVE    Comment: (NOTE) If result is NEGATIVE SARS-CoV-2 target nucleic acids are NOT DETECTED. The SARS-CoV-2 RNA is generally detectable in upper and lower  respiratory specimens during the acute phase of infection. The lowest  concentration of SARS-CoV-2 viral copies this assay can detect is 250  copies / mL. A negative result does not preclude SARS-CoV-2 infection  and should not be used as the sole basis for treatment or other  patient management decisions.  A negative result may occur with  improper specimen collection / handling, submission of specimen other  than nasopharyngeal swab, presence of viral mutation(s) within the  areas targeted by this assay, and inadequate number of viral copies  (<250 copies / mL). A negative result must be combined with clinical  observations, patient history, and epidemiological information. If result is POSITIVE SARS-CoV-2 target nucleic acids are DETECTED. The SARS-CoV-2 RNA is generally detectable in upper and lower  respiratory specimens dur ing the acute phase of infection.  Positive  results are indicative of active infection with SARS-CoV-2.  Clinical  correlation with patient history and other diagnostic information is  necessary to determine patient infection status.  Positive results do  not rule out bacterial infection or co-infection with other viruses. If result is PRESUMPTIVE POSTIVE SARS-CoV-2 nucleic acids MAY BE PRESENT.   A presumptive positive result was obtained on the submitted specimen  and confirmed  on repeat testing.  While 2019 novel coronavirus  (SARS-CoV-2) nucleic acids may be present in the submitted sample  additional confirmatory testing may be necessary for epidemiological  and / or clinical management purposes  to differentiate between  SARS-CoV-2 and other Sarbecovirus currently known to infect humans.  If clinically indicated additional testing with an alternate test  methodology [phone]) is advised. The SARS-CoV-2 RNA is generally  detectable in upper and lower respiratory sp ecimens during the acute  phase of infection. The expected result is Negative. Fact Sheet for Patients:  StrictlyIdeas.no Fact Sheet for Healthcare Providers: BankingDealers.co.za This test is not yet approved or cleared by the [place] FDA and has been authorized for detection and/or diagnosis of SARS-CoV-2 by FDA under an Emergency Use Authorization (EUA).  This EUA will remain in effect (meaning this test can be used) for the duration of the COVID-19 declaration under Section 564(b)(1) of the Act, 21 U.S.C. section 360bbb-3(b)(1), unless the authorization is terminated or revoked sooner. Performed at [facility], [address] [place], [place] [address]   Lactic acid, plasma     Status: None   Collection Time: 09/19/19  7:22 PM  Result Value Ref Range   Lactic Acid, Venous 0.9 0.5 - 1.9 mmol/L    Comment: Performed at [facility], [address] [place], [place] [address]  Troponin I (High Sensitivity)     Status: None   Collection Time: 09/19/19  7:22 PM  Result Value Ref Range   Troponin I (High Sensitivity)  10 <18 ng/L    Comment: (NOTE) Elevated high sensitivity troponin I (hsTnI) values and significant  changes across serial measurements may suggest ACS but many other  chronic and acute conditions are known to elevate hsTnI results.  Refer to the "Links" section for chest pain algorithms and additional   guidance. Performed at [facility], [address] [place], [place] [address]    Ct Head Wo Contrast  Result Date: 09/19/2019 CLINICAL DATA:  Altered level consciousness, EXAM: CT HEAD WITHOUT CONTRAST TECHNIQUE: Contiguous axial images were obtained from the base of the skull through the vertex without intravenous contrast. COMPARISON:  March 16, 2009 FINDINGS: Brain: No evidence of acute territorial infarction, hemorrhage, hydrocephalus,extra-axial collection or mass lesion/mass effect. There is dilatation the ventricles and sulci consistent with age-related atrophy. Low-attenuation changes in the deep white matter consistent with small vessel ischemia. Vascular: No hyperdense vessel or unexpected calcification. Skull: The skull is intact. No fracture or focal lesion identified. Sinuses/Orbits: The visualized paranasal sinuses and mastoid air cells are clear. The orbits and globes intact. Other: None IMPRESSION: 1.  No acute intracranial abnormality. 2. Findings consistent with age related atrophy and chronic small vessel ischemia Electronically Signed   By: Prudencio Pair M.D.   On: 09/19/2019 20:48   Dg Chest Portable 1 View  Result Date: 09/19/2019 CLINICAL DATA:  Patient with history of back fracture. EXAM: PORTABLE CHEST 1 VIEW COMPARISON:  Rib series 03/07/2009 FINDINGS: Cardiac contours upper limits of normal. Small bilateral pleural effusions, right-greater-than-left. Right basilar consolidation. Biapical pleuroparenchymal thickening left greater than right. Thoracic spine degenerative changes. Monitoring leads overlie the patient. IMPRESSION: Patchy consolidation within the right lower lung with right-greater-than-left pleural effusions. Opacities may represent atelectasis or infection. Electronically Signed   By: Lovey Newcomer M.D.   On: 09/19/2019 18:37    Pending Labs Unresulted Labs (From admission, onward)    Start     Ordered   09/19/19 2127  Procalcitonin - Baseline  ONCE  - STAT,   STAT     09/19/19 2127   Signed and Held  CBC  (enoxaparin (LOVENOX)    CrCl >/= 30 ml/min)  Once,   R    Comments: Baseline for enoxaparin therapy IF NOT ALREADY DRAWN.  Notify MD if PLT < 100 K.    Signed and Held   Signed and Held  Creatinine, serum  (enoxaparin (LOVENOX)    CrCl >/= 30 ml/min)  Once,   R    Comments: Baseline for enoxaparin therapy IF NOT ALREADY DRAWN.    Signed and Held   Signed and Held  Creatinine, serum  (enoxaparin (LOVENOX)    CrCl >/= 30 ml/min)  Weekly,   R    Comments: while on enoxaparin therapy    Signed and Held   Signed and Held  TSH  Once,   R     Signed and Held   Signed and Held  CBC  Tomorrow morning,   R     Signed and Held   Signed and Held  Basic metabolic panel  Tomorrow morning,   R     Signed and Held          Vitals/Pain Today's Vitals   09/19/19 1815 09/19/19 1830 09/19/19 1930 09/19/19 1945  BP: 127/87 113/72 118/69   Pulse: (!) 117  (!) 113   Resp: (!) 31 (!) 36 (!) 26   Temp:      SpO2: 94%  93%   Weight:  Height:      PainSc:    0-No pain    Isolation Precautions No active isolations  Medications Medications  vancomycin (VANCOCIN) IVPB 1000 mg/200 mL premix (1,000 mg Intravenous New Bag/Given 09/19/19 2106)  diltiazem (CARDIZEM) 100 mg in dextrose 5 % 100 mL (1 mg/mL) infusion (has no administration in time range)  ceFEPIme (MAXIPIME) 2 g in sodium chloride 0.9 % 100 mL IVPB (has no administration in time range)  sodium chloride 0.9 % bolus 500 mL (0 mLs Intravenous Stopped 09/19/19 1844)  diltiazem (CARDIZEM) injection 5 mg (5 mg Intravenous Given 09/19/19 1929)  ceFEPIme (MAXIPIME) 2 g in sodium chloride 0.9 % 100 mL IVPB (0 g Intravenous Stopped 09/19/19 2011)    Mobility non-ambulatory High fall risk   Focused Assessments Cardiac Assessment Handoff:  Cardiac Rhythm: Atrial fibrillation No results found for: CKTOTAL, CKMB, CKMBINDEX, TROPONINI No results found for: DDIMER Does the Patient  currently have chest pain? No      R Recommendations: See Admitting Provider Note  Report given to: 2A Nurse   Additional Notes:

## 2019-09-19 NOTE — ED Triage Notes (Signed)
Pt here via [facility] from [facility].  Per EMS- patient has a back fracture and was discharged from [facility] two days ago, facility said that patient has been unable to care for her self since returning. Pt had unwitnessed fall two days ago.  Pt denies pain at this time.   Ems VS- BP 135/79, T-98.3, HR 120, 95% RA.

## 2019-09-19 NOTE — Progress Notes (Signed)
Pharmacy Antibiotic Note  Denise Garrison is a 85 y.o. female admitted on 09/19/2019 with pneumonia.  Pharmacy has been consulted for Vancomycin, Cefepime dosing.  Plan: Cefepime 2 gm IV X 1 given in ED on 10/8 @ 1941. Cefepime 2 gm IV Q12H ordered to start on 10/9 @ 0800.   Vancomycin 1 gm IV X 1 given on 10/8 @ 2100. Vancomycin 1 gm IV Q36H ordered to start on 10/10 @ 0900. No peak or trough current ordered.   AUC = 483 Vd = 39.2 L  Ke = 0.035 hr-1 T1/2 = 19.7 hrs     Height: 5\' 4"  (162.6 cm) Weight: 120 lb (54.4 kg) IBW/kg (Calculated) : 54.7  Temp (24hrs), Avg:98.3 F (36.8 C), Min:98.3 F (36.8 C), Max:98.3 F (36.8 C)  Recent Labs  Lab 09/19/19 1722 09/19/19 1922  WBC 9.4  --   CREATININE 0.44  --   LATICACIDVEN 1.6 0.9    Estimated Creatinine Clearance: 36.9 mL/min (by C-G formula based on SCr of 0.44 mg/dL).    No Known Allergies  Antimicrobials this admission:   >>    >>   Dose adjustments this admission:   Microbiology results:  BCx:   UCx:    Sputum:    MRSA PCR:   Thank you for allowing pharmacy to be a part of this patient's care.  Jerrol Helmers D 09/19/2019 9:39 PM

## 2019-09-20 ENCOUNTER — Inpatient Hospital Stay (HOSPITAL_COMMUNITY)
Admit: 2019-09-20 | Discharge: 2019-09-20 | Disposition: A | Discharge status: Home / Self Care | Payer: PPO | Attending: Internal Medicine | Admitting: Internal Medicine

## 2019-09-20 ENCOUNTER — Other Ambulatory Visit: Payer: Self-pay

## 2019-09-20 ENCOUNTER — Inpatient Hospital Stay: Payer: PPO

## 2019-09-20 DIAGNOSIS — I361 Nonrheumatic tricuspid (valve) insufficiency: Secondary | ICD-10-CM

## 2019-09-20 DIAGNOSIS — I340 Nonrheumatic mitral (valve) insufficiency: Secondary | ICD-10-CM

## 2019-09-20 LAB — BASIC METABOLIC PANEL
Anion gap: 6 (ref 5–15)
BUN: 10 mg/dL (ref 8–23)
CO2: 29 mmol/L (ref 22–32)
Calcium: 8.2 mg/dL — ABNORMAL LOW (ref 8.9–10.3)
Chloride: 104 mmol/L (ref 98–111)
Creatinine, Ser: 0.38 mg/dL — ABNORMAL LOW (ref 0.44–1.00)
GFR calc Af Amer: >60 mL/min (ref >60)
GFR calc non Af Amer: >60 mL/min (ref >60)
Glucose, Bld: 108 mg/dL — ABNORMAL HIGH (ref 70–99)
Potassium: 3.3 mmol/L — ABNORMAL LOW (ref 3.5–5.1)
Sodium: 139 mmol/L (ref 135–145)

## 2019-09-20 LAB — CBC
HCT: 39 % (ref 36.0–46.0)
Hemoglobin: 12.1 g/dL (ref 12.0–15.0)
MCH: 27.4 pg (ref 26.0–34.0)
MCHC: 31 g/dL (ref 30.0–36.0)
MCV: 88.2 fL (ref 80.0–100.0)
Platelets: 372 10*3/uL (ref 150–400)
RBC: 4.42 MIL/uL (ref 3.87–5.11)
RDW: 14.2 % (ref 11.5–15.5)
WBC: 12.6 10*3/uL — ABNORMAL HIGH (ref 4.0–10.5)
nRBC: 0 % (ref 0.0–0.2)

## 2019-09-20 LAB — ECHOCARDIOGRAM COMPLETE
Height: 65 in
Weight: 1705.6 oz

## 2019-09-20 LAB — PROCALCITONIN: Procalcitonin: <0.1 ng/mL

## 2019-09-20 LAB — MRSA PCR SCREENING: MRSA by PCR: NEGATIVE

## 2019-09-20 LAB — TSH: TSH: 0.904 u[IU]/mL (ref 0.350–4.500)

## 2019-09-20 MED ORDER — ACETAMINOPHEN 650 MG RE SUPP: 650.0000 mg | Freq: Four times a day (QID) | RECTAL | Status: DC | PRN

## 2019-09-20 MED ORDER — ONDANSETRON HCL 4 MG PO TABS: 4.0000 mg | ORAL_TABLET | Freq: Four times a day (QID) | ORAL | Status: DC | PRN

## 2019-09-20 MED ORDER — SODIUM CHLORIDE 0.9 % IV SOLN
INTRAVENOUS | Status: DC | PRN
  Administered 2019-09-20: 500 mL via INTRAVENOUS
  Administered 2019-09-24 (×2): 250 mL via INTRAVENOUS

## 2019-09-20 MED ORDER — ENOXAPARIN SODIUM 40 MG/0.4ML ~~LOC~~ SOLN
40.0000 mg | SUBCUTANEOUS | Status: DC
  Administered 2019-09-21: 40 mg via SUBCUTANEOUS
  Filled 2019-09-20: qty 0.4

## 2019-09-20 MED ORDER — ENOXAPARIN SODIUM 30 MG/0.3ML ~~LOC~~ SOLN
30.0000 mg | SUBCUTANEOUS | Status: DC
  Administered 2019-09-20: 30 mg via SUBCUTANEOUS
  Filled 2019-09-20: qty 0.3

## 2019-09-20 MED ORDER — ACETAMINOPHEN 325 MG PO TABS: 650.0000 mg | ORAL_TABLET | Freq: Four times a day (QID) | ORAL | Status: DC | PRN

## 2019-09-20 MED ORDER — POTASSIUM CHLORIDE 10 MEQ/100ML IV SOLN
10.0000 meq | INTRAVENOUS | Status: AC
  Administered 2019-09-20 (×2): 10 meq via INTRAVENOUS
  Filled 2019-09-20 (×2): qty 100

## 2019-09-20 MED ORDER — SODIUM CHLORIDE 0.9 % IV SOLN
INTRAVENOUS | Status: DC
  Administered 2019-09-20 – 2019-09-21 (×3): via INTRAVENOUS

## 2019-09-20 MED ORDER — ASPIRIN EC 81 MG PO TBEC
81.0000 mg | DELAYED_RELEASE_TABLET | Freq: Every day | ORAL | Status: DC
  Administered 2019-09-24: 81 mg via ORAL
  Filled 2019-09-20: qty 1

## 2019-09-20 MED ORDER — ONDANSETRON HCL 4 MG/2ML IJ SOLN
4.0000 mg | Freq: Four times a day (QID) | INTRAMUSCULAR | Status: DC | PRN
  Administered 2019-09-23: 4 mg via INTRAVENOUS
  Filled 2019-09-20: qty 2

## 2019-09-20 MED ORDER — VITAMIN D 25 MCG (1000 UNIT) PO TABS
5000.0000 [IU] | ORAL_TABLET | Freq: Every day | ORAL | Status: DC
  Administered 2019-09-24: 5000 [IU] via ORAL
  Filled 2019-09-20: qty 5

## 2019-09-20 NOTE — Progress Notes (Signed)
Pharmacy Antibiotic Note  Denise Garrison is a 85 y.o. female admitted on 09/19/2019 with pneumonia.  Pharmacy has been consulted for Vancomycin, Cefepime dosing. Patient is afebrile with elevated WBC (12.6).   Imaging shows patchy consolidation within the right lower lung with right-greater-than-left pleural effusions. Opacities may represent atelectasis or infection.   Renal function seems stable, Scr 0.44 > 0.38.  We are on antibiotic day 2.  Plan: Cefepime 2 gm IV Q12H per CrCl 30-60 mL/min.   Height: 5\' 5"  (165.1 cm) Weight: 106 lb 9.6 oz (48.4 kg) IBW/kg (Calculated) : 57  Temp (24hrs), Avg:97.8 F (36.6 C), Min:97.6 F (36.4 C), Max:98.3 F (36.8 C)  Recent Labs  Lab 09/19/19 1722 09/19/19 1922 09/20/19 0211  WBC 9.4  --  12.6*  CREATININE 0.44  --  0.38*  LATICACIDVEN 1.6 0.9  --     Estimated Creatinine Clearance: 32.9 mL/min (A) (by C-G formula based on SCr of 0.38 mg/dL (L)).    No Known Allergies  Antimicrobials this admission:  10/8 Vanc >> 10/8  10/8 Cefepime >>  Microbiology results: 10/9 MRSA negative  Thank you for allowing pharmacy to be a part of this patient's care.  Gerald Dexter 09/20/2019 7:40 AM

## 2019-09-20 NOTE — Progress Notes (Signed)
*  PRELIMINARY RESULTS* Echocardiogram 2D Echocardiogram has been performed.  Wallie Char Morgin Halls 09/20/2019, 10:44 AM

## 2019-09-20 NOTE — Consult Note (Signed)
ORTHOPAEDIC CONSULTATION  REQUESTING PHYSICIAN: Vaughan Basta, *  Chief Complaint:   L1 compression fracture.  History of Present Illness: Denise Garrison is a 85 y.o. female with a history of dementia, osteoporosis, and hypercholesterolemia who fell 5 weeks ago, sustaining a small superior endplate fracture of L1.  She was treated with a back extension brace and sent to rehab.  Apparently the patient has been doing well until she was discharged from rehab several days ago under the auspices that she was able to care for herself at home.  She was taken in by her daughter who found that she was not able to care for herself and in fact fell trying to get out of her chair when the patient's daughter was not watching.  She was brought to the emergency room where she was noted to have an aspiration pneumonia and for which she has been admitted for further management.  The patient was seen in follow-up for her L1 compression fracture by neurosurgery 2 weeks ago and has a follow-up appointment in another 2 weeks or so.  However, I was asked to follow-up with this patient while she is in the hospital.  The patient denies any pain in her back when asked, and denies any numbness or paresthesias down either lower extremity.  However, she does complain of some back discomfort lying in bed while I was speaking with the patient's daughter at the bedside.  According to the daughter, the patient is not able to care for herself, get out of bed for self, or even let her know when she needs to urinate or move her bowels.  All of the symptoms are new over the past month or so.  Past Medical History:  Diagnosis Date  . B12 deficiency   . Cancer [facility])    history of melanoma, s/p excision x 3  . Cavernous hemangioma    History of  . Hiatal hernia    History of  . Hx of detached retina repair   . Hypercholesterolemia   . Osteoporosis    Past  Surgical History:  Procedure Laterality Date  . ABDOMINAL HYSTERECTOMY  1975   Secondary to fibroid tumors  . BREAST BIOPSY     Benign  . CHOLECYSTECTOMY  4/91  . EYE SURGERY     Bilateral Cataract   . SKIN CANCER EXCISION     Malignant Melanoma x3   Social History   Socioeconomic History  . Marital status: Widowed    Spouse name: Not on file  . Number of children: 2  . Years of education: Not on file  . Highest education level: Not on file  Occupational History  . Not on file  Social Needs  . Financial resource strain: Patient refused  . Food insecurity    Worry: Patient refused    Inability: Patient refused  . Transportation needs    Medical: Patient refused    Non-medical: Patient refused  Tobacco Use  . Smoking status: Never Smoker  . Smokeless tobacco: Never Used  Substance and Sexual Activity  . Alcohol use: Yes    Comment: occasional wine, rare  . Drug use: No  . Sexual activity: Not Currently  Lifestyle  . Physical activity    Days per week: Patient refused    Minutes per session: Patient refused  . Stress: Patient refused  Relationships  . Social connections    Talks on phone: Patient refused    Gets together: Patient refused    Attends religious  service: Patient refused    Active member of club or organization: Patient refused    Attends meetings of clubs or organizations: Patient refused    Relationship status: Patient refused  Other Topics Concern  . Not on file  Social History Narrative   She is a widow ,has two children one son and one daughter.  She resided at [place] History  Problem Relation Age of Onset  . Kidney disease Father   . Heart disease Father   . Kidney disease Brother   . Parkinson's disease Brother   . Melanoma Brother   . Breast cancer Neg Hx   . Colon cancer Neg Hx    No Known Allergies Prior to Admission medications   Medication Sig Start Date End Date Taking? Authorizing Provider  Cholecalciferol (D  5000) 125 MCG (5000 UT) capsule Take 5,000 Units by mouth daily.    Yes [provider]  cyanocobalamin (,VITAMIN B-12,) 1000 MCG/ML injection Inject 1,000 mcg into the muscle every 30 (thirty) days.   Yes [provider]  acetaminophen (TYLENOL) 325 MG tablet Take 2 tablets (650 mg total) by mouth every 6 (six) hours as needed for mild pain (or Fever >/= 101). Patient not taking: Reported on 09/19/2019 08/21/19   Dustin Flock, MD  docusate sodium (COLACE) 100 MG capsule Take 1 capsule (100 mg total) by mouth 2 (two) times daily. Patient not taking: Reported on 09/19/2019 08/21/19   Dustin Flock, MD   Ct Head Wo Contrast  Result Date: 09/19/2019 CLINICAL DATA:  Altered level consciousness, EXAM: CT HEAD WITHOUT CONTRAST TECHNIQUE: Contiguous axial images were obtained from the base of the skull through the vertex without intravenous contrast. COMPARISON:  March 16, 2009 FINDINGS: Brain: No evidence of acute territorial infarction, hemorrhage, hydrocephalus,extra-axial collection or mass lesion/mass effect. There is dilatation the ventricles and sulci consistent with age-related atrophy. Low-attenuation changes in the deep white matter consistent with small vessel ischemia. Vascular: No hyperdense vessel or unexpected calcification. Skull: The skull is intact. No fracture or focal lesion identified. Sinuses/Orbits: The visualized paranasal sinuses and mastoid air cells are clear. The orbits and globes intact. Other: None IMPRESSION: 1.  No acute intracranial abnormality. 2. Findings consistent with age related atrophy and chronic small vessel ischemia Electronically Signed   By: Prudencio Pair M.D.   On: 09/19/2019 20:48   Dg Chest Portable 1 View  Result Date: 09/19/2019 CLINICAL DATA:  Patient with history of back fracture. EXAM: PORTABLE CHEST 1 VIEW COMPARISON:  Rib series 03/07/2009 FINDINGS: Cardiac contours upper limits of normal. Small bilateral pleural effusions,  right-greater-than-left. Right basilar consolidation. Biapical pleuroparenchymal thickening left greater than right. Thoracic spine degenerative changes. Monitoring leads overlie the patient. IMPRESSION: Patchy consolidation within the right lower lung with right-greater-than-left pleural effusions. Opacities may represent atelectasis or infection. Electronically Signed   By: Lovey Newcomer M.D.   On: 09/19/2019 18:37    Positive ROS: All other systems have been reviewed and were otherwise negative with the exception of those mentioned in the HPI and as above.  Physical Exam: General:  Alert, no acute distress Psychiatric:  Patient is not competent for consent, but exhibits normal mood and affect   Cardiovascular:  No pedal edema Respiratory:  No wheezing, non-labored breathing GI:  Abdomen is soft and non-tender Skin:  No lesions in the area of chief complaint Neurologic:  Sensation intact distally Lymphatic:  No axillary or cervical lymphadenopathy  Orthopedic Exam:  Orthopedic examination is limited to  her back and lower extremities.  Skin inspection of her back is unremarkable.  No swelling, erythema, ecchymosis, abrasions, or other skin abnormalities are identified.  She denies any tenderness to percussion along her thoracic or upper lumbar spine.  She is able to dorsiflex and plantarflex her toes and ankles bilaterally.  Sensation is intact to light touch to all distributions.  She has good capillary refill to both feet.  X-rays:  AP and lateral x-rays of the lumbar spine from earlier today are available for review and have been reviewed by myself.  These films demonstrate a significant compression fracture of L1 of at least 60%.  This has progressed substantially as compared to her prior films from 5 weeks ago.  No other acute bony abnormalities are identified.  Assessment: Progressive L1 compression fracture.  Plan: The treatment options have been discussed with the patient and the  patient's daughter who is at the bedside, including both continued nonsurgical treatment to include use of the back extension brace and tincture of time versus a kyphoplasty.  The patient's daughter does not wish the patient to undergo any type of surgical intervention at this time.  Therefore, we will continue to manage this nonsurgically with her back brace and appropriate medication to take as necessary for comfort.  She may be mobilized as symptoms permit.  Thank you for asking me to participate in the care of this most pleasant yet unfortunate woman.  Please arrange for her to follow-up in the office with Neurosurgery as scheduled in 2 weeks.   Pascal Lux, MD  Beeper #:  [phone]  09/20/2019 3:42 PM

## 2019-09-20 NOTE — Evaluation (Signed)
Physical Therapy Evaluation Patient Details Name: Denise Garrison MRN: [id number] DOB: 06/04/1923 Today's Date: 09/20/2019   History of Present Illness  Denise Garrison  is a 85 y.o. female with a known history of B12 deficiency, hyperlipidemia who was brought to the hospital with generalized weakness.  Patient was recently at a rehab facility ([facility]) and sent to assisted living ([facility]). She arrived to hospital ED by EMS on 09/19/2019 and admitted with A.fib with RVS and HCAP. Recent L1 fracture with [place] hyperextehnsion TLSO prescribed to wear when OOB.    Clinical Impression  Patient pleasantly confused but slightly anxious, oriented to person only and unable to provide history. Prior documentation states patient is from ALF that she stayed at for 2 days following discharge from rehab facility and was unable to care for her self adequately in the ALF setting. Upon physical therapy evaluation, patient required cuing for all activities and constant re-orientation to the situation and location. Patient required mod A for bed mobility, min A for sit <> stand transfers using RW. She was only able to walk about 8 feet with min A and RW due to heart rate climbing higher than age predicted max HR suggesting arrhythmia. Patient's HR returned to baseline of low 100s with rest by end of session. Patient used 2L/min O2 throughout session with SpO2 remaining WFL. Required total assist to don socks and TLSO. Patient appears to have decreased functional mobility/strength and requires constant supervision for safety. She would benefit from short term rehabilitation to improve functional independence. Patient would benefit from physical therapy to address remaining impairments and functional limitations (see PT Problem List below) to work towards stated goals and return to PLOF or maximal functional independence.      Follow Up Recommendations Supervision/Assistance - 24 hour;SNF    Equipment  Recommendations  Rolling walker with 5" wheels;3in1 (PT)    Recommendations for Other Services       Precautions / Restrictions Precautions Precautions: Fall Precaution Comments: no Flexion. TLSO brace when sitting and OOB Required Braces or Orthoses: Spinal Brace Spinal Brace: Thoracolumbosacral orthotic;Applied in sitting position;Applied in standing position;Other (comment) Spinal Brace Comments: applied when OOB Restrictions Weight Bearing Restrictions: No      Mobility  Bed Mobility Overal bed mobility: Needs Assistance Bed Mobility: Supine to Sit     Supine to sit: Mod assist     General bed mobility comments: patient able to roll to side and kick feet oof edge of bed while holding railing but required mod A to push up from sidelying to sitting. Required total assistance to don TLSO.  Transfers Overall transfer level: Needs assistance Equipment used: Rolling walker (2 wheeled) Transfers: Sit to/from Stand Sit to Stand: Min assist         General transfer comment: Patient transfered sit <> stand with RW and Min A bed to chair and chair to chair. Required cuing for and placement and for adequate forward shift. Failed to reach back when sitting. Required step by step cuing. HR climbed to 127 bpm during transfer, returned to low 100s with rest.  Ambulation/Gait   Gait Distance (Feet): 8 Feet Assistive device: Rolling walker (2 wheeled) Gait Pattern/deviations: Decreased stride length Gait velocity: very slow   General Gait Details: Patient ambulated 8 feet using RW and min A to navigate around furnature in the room. Pateint confused and required significant cuing for direction. HR climbed over 127 bpm so discontinued. HR returned to lwo 100s with rest.  Stairs  Wheelchair Mobility    Modified Rankin (Stroke Patients Only)       Balance Overall balance assessment: Needs assistance Sitting-balance support: Bilateral upper extremity  supported;Feet supported Sitting balance-Leahy Scale: Fair Sitting balance - Comments: patient able to sit at EOB with both hands down. Total assist to don TLSO   Standing balance support: Bilateral upper extremity supported;During functional activity Standing balance-Leahy Scale: Fair Standing balance comment: Patient dependend on BUE support on RW                             Pertinent Vitals/Pain Pain Assessment: Faces Pain Score: 0-No pain Faces Pain Scale: No hurt    Home Living Family/patient expects to be discharged to:: Assisted living                 Additional Comments: patient oriented to self only. Confused and unable to provide history. Per documentation, pateint has been living at [facility] assisted living facility. She was there 2 days following discharge from SNF rehab facility and came to the hospital after she required more assistance than available for ADLs.    Prior Function           Comments: Pateint unable to report. Confused.     Hand Dominance        Extremity/Trunk Assessment   Upper Extremity Assessment Upper Extremity Assessment: Generalized weakness    Lower Extremity Assessment Lower Extremity Assessment: Generalized weakness    Cervical / Trunk Assessment Cervical / Trunk Assessment: Kyphotic  Communication   Communication: HOH  Cognition Arousal/Alertness: Awake/alert Behavior During Therapy: Anxious;WFL for tasks assessed/performed Overall Cognitive Status: No family/caregiver present to determine baseline cognitive functioning                                 General Comments: patient oriented to self only. Repeatedly asks if she is in the correct place and where her husband is. Cannot remember the answer shortly after it is given.      General Comments      Exercises Other Exercises Other Exercises: educated pt on role of PT in acute care setting. Reoriented patient repeately.    Assessment/Plan    PT Assessment Patient needs continued PT services  PT Problem List Decreased strength;Decreased mobility;Decreased safety awareness;Decreased knowledge of precautions;Cardiopulmonary status limiting activity;Decreased cognition;Decreased activity tolerance;Decreased balance;Decreased knowledge of use of DME;Decreased skin integrity       PT Treatment Interventions DME instruction;Therapeutic activities;Cognitive remediation;Gait training;Therapeutic exercise;Patient/family education;Balance training;Functional mobility training;Neuromuscular re-education    PT Goals (Current goals can be found in the Care Plan section)  Acute Rehab PT Goals PT Goal Formulation: Patient unable to participate in goal setting Time For Goal Achievement: 10/04/19    Frequency Min 2X/week   Barriers to discharge Decreased caregiver support patient requires 24/7 care/supervision    Co-evaluation               AM-PAC PT "6 Clicks" Mobility  Outcome Measure Help needed turning from your back to your side while in a flat bed without using bedrails?: A Little Help needed moving from lying on your back to sitting on the side of a flat bed without using bedrails?: A Lot Help needed moving to and from a bed to a chair (including a wheelchair)?: A Little Help needed standing up from a chair using your arms (e.g., wheelchair or bedside chair)?: A  Little Help needed to walk in hospital room?: A Little Help needed climbing 3-5 steps with a railing? : Total 6 Click Score: 15    End of Session Equipment Utilized During Treatment: Gait belt;Other (comment)(TLSO) Activity Tolerance: Patient tolerated treatment well;No increased pain;Treatment limited secondary to medical complications (Comment)(limited by heart rate) Patient left: in chair;with call bell/phone within reach;with chair alarm set Nurse Communication: Mobility status;Precautions;Other (comment)(educated on TLSO) PT Visit  Diagnosis: Unsteadiness on feet (R26.81);Muscle weakness (generalized) (M62.81);Difficulty in walking, not elsewhere classified (R26.2)    Time: 1130-1200 PT Time Calculation (min) (ACUTE ONLY): 30 min   Charges:   PT Evaluation $PT Eval Moderate Complexity: 1 Mod PT Treatments $Therapeutic Exercise: 8-22 mins        Everlean Alstrom. Graylon Good, PT, DPT 09/20/19, 1:24 PM

## 2019-09-20 NOTE — Progress Notes (Signed)
Unable to complete patient admission profile due to patient's cognitive ability.

## 2019-09-20 NOTE — Progress Notes (Signed)
Initial Nutrition Assessment  RD working remotely.  DOCUMENTATION CODES:   Underweight  INTERVENTION:  Will monitor for diet advancement and outcome of discussions regarding goals of care and recommend appropriate nutrition intervention at that time.  NUTRITION DIAGNOSIS:   Inadequate oral intake related to inability to eat, lethargy/confusion as evidenced by NPO status.  GOAL:   Patient will meet greater than or equal to 90% of their needs  MONITOR:   Diet advancement, Labs, Weight trends, I & O's  REASON FOR ASSESSMENT:   Other (Comment)(Low BMI)    ASSESSMENT:   85 year old female with PMHx of osteoporosis, hx hiatal hernia, vitamin B12 deficiency admitted with weakness, HCAP, A-fib with RVR.   Patient unable to provide any history over the phone. Patient underwent SLP evaluation today. Recommendation was for patient to remain NPO at this time until objective assessment can be performed. Will continue to monitor for plan of care. Patient's weight history in chart appears stable for the past 2 years. She is at risk for malnutrition. Unable to determine if she meets criteria without completing NFPE. Palliative care has been consulted to discuss goals of care.  Medications reviewed and include: vitamin D3 5000 units daily, NS at 50 mL/hr, cefepime.  Labs reviewed: Potassium 3.3, Creatinine 0.38.  NUTRITION - FOCUSED PHYSICAL EXAM:  Unable to complete at this time.  Diet Order:   Diet Order            Diet NPO time specified  Diet effective now             EDUCATION NEEDS:   Not appropriate for education at this time  Skin:  Skin Assessment: Skin Integrity Issues:(MSAD to perineum)  Last BM:  Unknown  Height:   Ht Readings from Last 1 Encounters:  09/20/19 5\' 5"  (1.651 m)   Weight:   Wt Readings from Last 1 Encounters:  09/20/19 48.4 kg   Ideal Body Weight:  56.8 kg  BMI:  Body mass index is 17.74 kg/m.  Estimated Nutritional Needs:   Kcal:   1200-1400  Protein:  60-70 grams  Fluid:  1.2-1.4 L/day  Willey Blade, MS, RD, LDN Office: [phone] Pager: [phone] After Hours/Weekend Pager: [phone]

## 2019-09-20 NOTE — Progress Notes (Signed)
PHARMACIST - PHYSICIAN COMMUNICATION  CONCERNING:  Enoxaparin (Lovenox) for DVT Prophylaxis    RECOMMENDATION: Patient was prescribed enoxaprin 30mg  q24 hours for VTE prophylaxis.   Filed Weights   09/19/19 1715 09/20/19 0120  Weight: 120 lb (54.4 kg) 106 lb 9.6 oz (48.4 kg)    Body mass index is 17.74 kg/m.  Estimated Creatinine Clearance: 32.9 mL/min (A) (by C-G formula based on SCr of 0.38 mg/dL (L)).   Based on [place] patient is candidate for enoxaparin 40mg  every Q24 hour dosing due to improved renal function > 30 mL/min  DESCRIPTION: Pharmacy has adjusted enoxaparin dose per [facility] policy.  Patient is now receiving enoxaparin 40mg  every 24 hours.    Gerald Dexter, PharmD Pharmacy Resident  09/20/2019 12:41 PM

## 2019-09-20 NOTE — Evaluation (Signed)
Clinical/Bedside Swallow Evaluation Patient Details  Name: Denise Garrison. [place] MRN: [id number] Date of Birth: [birth year]  Today's Date: 09/20/2019 Time: SLP Start Time (ACUTE ONLY): 66 SLP Stop Time (ACUTE ONLY): 1010 SLP Time Calculation (min) (ACUTE ONLY): 50 min  Past Medical History:  Past Medical History:  Diagnosis Date  . B12 deficiency   . Cancer [facility])    history of melanoma, s/p excision x 3  . Cavernous hemangioma    History of  . Hiatal hernia    History of  . Hx of detached retina repair   . Hypercholesterolemia   . Osteoporosis    Past Surgical History:  Past Surgical History:  Procedure Laterality Date  . ABDOMINAL HYSTERECTOMY  1975   Secondary to fibroid tumors  . BREAST BIOPSY     Benign  . CHOLECYSTECTOMY  4/91  . EYE SURGERY     Bilateral Cataract   . SKIN CANCER EXCISION     Malignant Melanoma x3   HPI:  Pt is a 85 y.o. female with PMH as noted below including recent L1 fracture(08/2019) who presents to the ED essentially for inability to take care of herself adequately with her current level of care.  Per EMS and paperwork, she was discharged from [facility] rehabilitation facility 2 days ago to [facility] which is an independent living facility.  To be able to live at [facility], she would need to be able to complete her ADLs independently, however the patient has been unable to do so over these last 2 days and requires assistance with ambulation even with a walker.  She also has apparently had an unwitnessed fall a few days ago.  Pt has a medical history including Closed compression fracture of L1 vertebra and at the same time of a UTI; baseline dx of Cognitive dysfunction.  CXR: Patchy consolidation within the right lower lung with right-greater-than-left pleural effusions.    Assessment / Plan / Recommendation Clinical Impression  Pt appears to present w/ oropharyngeal phase dysphagia c/b immediate and delayed s/s of aspiration w/ trials of ice chips  then Puree(increased viscosity). Unsure of pt's baseline swallow function; noted CXR presentation. Pt denied any deficits but family has reported such per NSG report. Pt appeared to present w/ potential difficulty fully clearing pharynx of bolus residue -- noted multiple swallows, delayed throat clearing fairly immediately. Overt coughing followed given time w/ both consistencies. Oral phase was adequate for bolus management and A-P transfer for swallowing. After po trials, pt presented w/ a mildly wet vocal quality and continued throat clearing. Of note, pt was carefully positioned upright for BSE; head supported forward w/ towel rolls d/t back brace in place and restricting ROM of neck. Pt was assisted w/ feeding d/t pt's report of vision deficits(NSG made aware). OM exam was grossly [facility] w/ no unilateral weakness noted; speech mildy decreased in articulation at times. Noted possible velopharyngeal incompetency w/ nasal emmissions; MD made aware. Due to pt's presentation and increased risk for aspiration thus Pulmonary decline, recommend pt remain NPO until objective assessment can be performed to better assess swallow function and aspiration risk. This was discussed w/ MD who agreed. Recommend frequent oral care w/ aspiration precautions and Supervision until then. NSG updated.  SLP Visit Diagnosis: Dysphagia, oropharyngeal phase (R13.12)    Aspiration Risk  Moderate aspiration risk;Severe aspiration risk;Risk for inadequate nutrition/hydration    Diet Recommendation  NPO status including medications; frequent oral care for hygiene and stimulation of swallowing -- Supervision and aspiration precautions  Medication Administration: Via alternative means    Other  Recommendations Recommended Consults: (Dietician f/u; Palliative care f/u for [name] discussion) Oral Care Recommendations: Oral care QID;Staff/trained caregiver to provide oral care(aspiration precautions) Other Recommendations: (TBD)   Follow up  Recommendations (TBD)      Frequency and Duration min 3x week  2 weeks       Prognosis Prognosis for Safe Diet Advancement: Guarded Barriers to Reach Goals: Cognitive deficits;Severity of deficits(advanced age)      Denise Garrison Study   General Date of Onset: 09/19/19 HPI: Pt is a 85 y.o. female with PMH as noted below including recent L1 fracture(08/2019) who presents to the ED essentially for inability to take care of herself adequately with her current level of care.  Per EMS and paperwork, she was discharged from [facility] rehabilitation facility 2 days ago to [facility] which is an independent living facility.  To be able to live at [facility], she would need to be able to complete her ADLs independently, however the patient has been unable to do so over these last 2 days and requires assistance with ambulation even with a walker.  She also has apparently had an unwitnessed fall a few days ago.  Pt has a medical history including Closed compression fracture of L1 vertebra and at the same time of a UTI; baseline dx of Cognitive dysfunction.  CXR: Patchy consolidation within the right lower lung with right-greater-than-left pleural effusions.  Type of Study: Bedside Swallow Evaluation Previous Swallow Assessment: none noted Diet Prior to this Study: NPO Temperature Spikes Noted: No(wbc 12.6) Respiratory Status: Nasal cannula(2 L) History of Recent Intubation: No Behavior/Cognition: Alert;Cooperative;Pleasant mood;Confused;Distractible;Requires cueing Oral Cavity Assessment: Dry Oral Care Completed by SLP: Yes Oral Cavity - Dentition: Missing dentition Vision: (pt c/o "I cannot see" often but denied changes) Self-Feeding Abilities: Total assist Patient Positioning: Upright in bed(needed full positioning assist) Baseline Vocal Quality: Normal Volitional Cough: Cognitively unable to elicit Volitional Swallow: Unable to elicit    Oral/Motor/Sensory Function Overall Oral Motor/Sensory  Function: Within functional limits(grossly - no unilateral weakness)   Ice Chips Ice chips: Impaired Presentation: Spoon(fed; 2 trials) Oral Phase Impairments: (adequate) Oral Phase Functional Implications: (adequate) Pharyngeal Phase Impairments: Cough - Delayed;Throat Clearing - Delayed(x2/2 trials)   Thin Liquid Thin Liquid: Not tested    Nectar Thick Nectar Thick Liquid: Not tested   Honey Thick Honey Thick Liquid: Not tested   Puree Puree: Impaired Presentation: Spoon(fed; 2 (half) tsp trials) Oral Phase Impairments: (adequate ) Oral Phase Functional Implications: (adequate) Pharyngeal Phase Impairments: Multiple swallows;Suspected delayed Swallow;Wet Vocal Quality;Cough - Delayed;Throat Clearing - Delayed   Solid     Solid: Not tested       Orinda Kenner, MS, CCC-SLP Denise Garrison 09/20/2019,2:02 PM

## 2019-09-21 LAB — CREATININE, SERUM
Creatinine, Ser: 0.39 mg/dL — ABNORMAL LOW (ref 0.44–1.00)
GFR calc Af Amer: >60 mL/min (ref >60)
GFR calc non Af Amer: >60 mL/min (ref >60)

## 2019-09-21 MED ORDER — ENOXAPARIN SODIUM 30 MG/0.3ML ~~LOC~~ SOLN
30.0000 mg | SUBCUTANEOUS | Status: DC
  Administered 2019-09-22 – 2019-09-23 (×2): 30 mg via SUBCUTANEOUS
  Filled 2019-09-21 (×2): qty 0.3

## 2019-09-21 NOTE — Progress Notes (Signed)
[place] at [place] NAME: Denise Garrison    MR#:  [id number]  DATE OF BIRTH:  05/29/1923  SUBJECTIVE:  CHIEF COMPLAINT:   Chief Complaint  Patient presents with  . Weakness   Came with significant weakness and daughter has noticed some choking episodes at home in last few days. Patient was admitted to hospital for vertebral fracture last month and was sent home with brace.  She went to rehab for 2 3 weeks where she had some recovery but has been not much mobile since came home for 1 week. REVIEW OF SYSTEMS:  Due to dementia and old age with altered mental status patient is not able to give reliable review of system.  Patient's daughter was present in the room. ROS  DRUG ALLERGIES:  No Known Allergies  VITALS:  Blood pressure 116/71, pulse 90, temperature (!) 97.4 F (36.3 C), temperature source Oral, resp. rate 18, height 5\' 5"  (1.651 m), weight 44 kg, SpO2 92 %.  PHYSICAL EXAMINATION:  GENERAL:  85 y.o.-year-old thin patient lying in the bed with no acute distress.  EYES: Pupils equal, round, reactive to light and accommodation. No scleral icterus. Extraocular muscles intact.  HEENT: Head atraumatic, normocephalic. Oropharynx and nasopharynx clear.  NECK:  Supple, no jugular venous distention. No thyroid enlargement, no tenderness.  LUNGS: Decreased breath sounds bilaterally, no wheezing, rales,rhonchi or crepitation. No use of accessory muscles of respiration.  CARDIOVASCULAR: S1, S2 normal. No murmurs, rubs, or gallops.  ABDOMEN: Soft, nontender, nondistended. Bowel sounds present. No organomegaly or mass.  Immobilizer brace present due to vertebral fracture. EXTREMITIES: No pedal edema, cyanosis, or clubbing.  NEUROLOGIC: Cranial nerves II through XII are intact. Muscle strength 2-3/5 in all extremities. Sensation intact. Gait not checked.  PSYCHIATRIC: The patient is alert and oriented x 0.  SKIN: No obvious rash, lesion, or ulcer.    Physical Exam LABORATORY PANEL:   CBC Recent Labs  Lab 09/20/19 0211  WBC 12.6*  HGB 12.1  HCT 39.0  PLT 372   ------------------------------------------------------------------------------------------------------------------  Chemistries  Recent Labs  Lab 09/19/19 1722 09/20/19 0211 09/21/19 0530  NA 140 139  --   K 3.6 3.3*  --   CL 99 104  --   CO2 29 29  --   GLUCOSE 109* 108*  --   BUN 11 10  --   CREATININE 0.44 0.38* 0.39*  CALCIUM 9.0 8.2*  --   AST 19  --   --   ALT 15  --   --   ALKPHOS 94  --   --   BILITOT 0.5  --   --    ------------------------------------------------------------------------------------------------------------------  Cardiac Enzymes No results for input(s): TROPONINI in the last 168 hours. ------------------------------------------------------------------------------------------------------------------  RADIOLOGY:  Dg Lumbar Spine 2-3 Views  Result Date: 09/20/2019 CLINICAL DATA:  Closed compression fracture L1 vertebra EXAM: LUMBAR SPINE - 2-3 VIEW COMPARISON:  08/16/2019 FINDINGS: Severe compression fracture L1 has progressed significantly since the prior study. Mild retrolisthesis of bone into the canal. Osteopenia. No other fractures. Suboptimal lateral view which is oblique. Normal alignment. No pars defect. Disc spaces maintained throughout the lumbar spine IMPRESSION: Severe compression fracture L1 with significant progression since 08/16/2019. Mild retropulsion of bone into the canal. Recommend CT lumbar spine for further evaluation of the spinal canal. Electronically Signed   By: Franchot Gallo M.D.   On: 09/20/2019 15:48   Ct Head Wo Contrast  Result Date: 09/19/2019 CLINICAL DATA:  Altered  level consciousness, EXAM: CT HEAD WITHOUT CONTRAST TECHNIQUE: Contiguous axial images were obtained from the base of the skull through the vertex without intravenous contrast. COMPARISON:  March 16, 2009 FINDINGS: Brain: No evidence of  acute territorial infarction, hemorrhage, hydrocephalus,extra-axial collection or mass lesion/mass effect. There is dilatation the ventricles and sulci consistent with age-related atrophy. Low-attenuation changes in the deep white matter consistent with small vessel ischemia. Vascular: No hyperdense vessel or unexpected calcification. Skull: The skull is intact. No fracture or focal lesion identified. Sinuses/Orbits: The visualized paranasal sinuses and mastoid air cells are clear. The orbits and globes intact. Other: None IMPRESSION: 1.  No acute intracranial abnormality. 2. Findings consistent with age related atrophy and chronic small vessel ischemia Electronically Signed   By: Prudencio Pair M.D.   On: 09/19/2019 20:48   Dg Chest Portable 1 View  Result Date: 09/19/2019 CLINICAL DATA:  Patient with history of back fracture. EXAM: PORTABLE CHEST 1 VIEW COMPARISON:  Rib series 03/07/2009 FINDINGS: Cardiac contours upper limits of normal. Small bilateral pleural effusions, right-greater-than-left. Right basilar consolidation. Biapical pleuroparenchymal thickening left greater than right. Thoracic spine degenerative changes. Monitoring leads overlie the patient. IMPRESSION: Patchy consolidation within the right lower lung with right-greater-than-left pleural effusions. Opacities may represent atelectasis or infection. Electronically Signed   By: Lovey Newcomer M.D.   On: 09/19/2019 18:37    ASSESSMENT AND PLAN:   Active Problems:   A-fib ([place])  1.    Aspiration pneumonia-dysphagia Initially started on Vanco and cefepime but vancomycin stopped with negative MRSA. Swallow evaluation is done-suggested to have significant aspirations and keep n.p.o.  If patient has some improvement then she might suggest to do a barium swallow study after the weekend. I also called palliative care for further discussion of goals of care.  2.  A. fib with RVR which is new for this patient Started on Cardizem drip.  Cannot  switch to oral as patient is n.p.o. currently.   She is not a good candidate for anticoagulation due to fall risk Normal EF as per echocardiogram.  If her heart rate is not controlled consider cardiology input  3.  Recent L1 fracture continue brace as prescribed  Called orthopedic consult for further guidance.  4.  Miscellaneous Lovenox for DVT prophylaxis  5.  Hypokalemia-replace IV.  All the records are reviewed and case discussed with Care Management/Social Workerr. Management plans discussed with the patient, family and they are in agreement.  CODE STATUS: DNR  TOTAL TIME TAKING CARE OF THIS PATIENT: *45 minutes.   Discussed with patient's daughter in the room.  POSSIBLE D/C IN 2-3 DAYS, DEPENDING ON CLINICAL CONDITION.   Vaughan Basta M.D on 09/21/2019   Between 7am to 6pm - Pager - [phone]  After 6pm go to [email or web address] - password [email or web address] [facility]  Office  [phone]  CC: Primary care physician; Einar Pheasant, MD  Note: This dictation was prepared with Dragon dictation along with smaller phrase technology. Any transcriptional errors that result from this process are unintentional.

## 2019-09-21 NOTE — Progress Notes (Deleted)
Family Meeting Note  Advance Directive:yes  Today a meeting took place with the daughter.  Patient is unable to participate due [id number] capacity old age, dementia   The following clinical team members were present during this meeting:MD  The following were discussed:Patient's diagnosis: Vertical fracture, old age and dementia, significant functional decline, aspirations, Patient's progosis: Unable to determine and Goals for treatment: DNR  I had detailed discussion with her daughter regarding progressive worsening dementia and patient may not be able to return to her baseline functional status as it was a month ago before she had a fall.  With difficulty swallowing and recurrent aspiration she would be at high risk of recurrent infections and malnutrition which would make her decline significantly faster in next few weeks to months.  Her prognosis is not very good and quality of life may be poor.  Daughter agrees for DNR and she would like to meet with palliative care or hospice on Monday.  Additional follow-up to be provided: Palliative care  Time spent during discussion:20 minutes  Vaughan Basta, MD

## 2019-09-21 NOTE — Progress Notes (Signed)
PHARMACIST - PHYSICIAN COMMUNICATION  CONCERNING:  Enoxaparin (Lovenox) for DVT Prophylaxis    RECOMMENDATION: Patient was prescribed enoxaprin 40mg  q24 hours for VTE prophylaxis.   Filed Weights   09/19/19 1715 09/20/19 0120 09/21/19 0405  Weight: 120 lb (54.4 kg) 106 lb 9.6 oz (48.4 kg) 96 lb 14.4 oz (44 kg)    Body mass index is 16.13 kg/m.  Estimated Creatinine Clearance: 29.9 mL/min (A) (by C-G formula based on SCr of 0.39 mg/dL (L)).  Patient is candidate for enoxaparin 30mg  every 24 hours based on Weight less then 45kg   DESCRIPTION: Pharmacy has adjusted enoxaparin dose.   Patient is now receiving enoxaparin 30mg  every 24 hours.  Pernell Dupre, PharmD, BCPS Clinical Pharmacist 09/21/2019 12:34 PM

## 2019-09-21 NOTE — Progress Notes (Signed)
Family Meeting Note  Advance Directive:yes  Today a meeting took place with the daughter.  Patient is unable to participate due [id number] capacity old age, dementia   The following clinical team members were present during this meeting:MD  The following were discussed:Patient's diagnosis: Vertical fracture, old age and dementia, significant functional decline, aspirations, Patient's progosis: Unable to determine and Goals for treatment: DNR  I had detailed discussion with her daughter regarding progressive worsening dementia and patient may not be able to return to her baseline functional status as it was a month ago before she had a fall.  With difficulty swallowing and recurrent aspiration she would be at high risk of recurrent infections and malnutrition which would make her decline significantly faster in next few weeks to months.  Her prognosis is not very good and quality of life may be poor.  Daughter agrees for DNR and she would like to meet with palliative care or hospice on Monday.  Additional follow-up to be provided: Palliative care  Time spent during discussion:20 minutes  Vaughan Basta, MD

## 2019-09-21 NOTE — Progress Notes (Signed)
Denise Garrison at [place] NAME: Denise Garrison    MR#:  [id number]  DATE OF BIRTH:  Jul 19, 1923  SUBJECTIVE:  CHIEF COMPLAINT:   Chief Complaint  Patient presents with  . Weakness   Came with significant weakness and daughter has noticed some choking episodes at home in last few days. Patient was admitted to hospital for vertebral fracture last month and was sent home with brace.  She went to rehab for 2 3 weeks where she had some recovery but has been not much mobile since came home for 1 week. REVIEW OF SYSTEMS:  Due to dementia and old age with altered mental status patient is not able to give reliable review of system.  Patient's daughter was present in the room. ROS  DRUG ALLERGIES:  No Known Allergies  VITALS:  Blood pressure 116/71, pulse 90, temperature (!) 97.4 F (36.3 C), temperature source Oral, resp. rate 18, height 5\' 5"  (1.651 m), weight 44 kg, SpO2 92 %.  PHYSICAL EXAMINATION:  GENERAL:  85 y.o.-year-old thin patient lying in the bed with no acute distress.  EYES: Pupils equal, round, reactive to light and accommodation. No scleral icterus. Extraocular muscles intact.  HEENT: Head atraumatic, normocephalic. Oropharynx and nasopharynx clear.  NECK:  Supple, no jugular venous distention. No thyroid enlargement, no tenderness.  LUNGS: Decreased breath sounds bilaterally, no wheezing, rales,rhonchi or crepitation. No use of accessory muscles of respiration.  CARDIOVASCULAR: S1, S2 normal. No murmurs, rubs, or gallops.  ABDOMEN: Soft, nontender, nondistended. Bowel sounds present. No organomegaly or mass.  Immobilizer brace present due to vertebral fracture. EXTREMITIES: No pedal edema, cyanosis, or clubbing.  NEUROLOGIC: Cranial nerves II through XII are intact. Muscle strength 2-3/5 in all extremities. Sensation intact. Gait not checked.  PSYCHIATRIC: The patient is alert and oriented x 0.  SKIN: No obvious rash, lesion, or ulcer.    Physical Exam LABORATORY PANEL:   CBC Recent Labs  Lab 09/20/19 0211  WBC 12.6*  HGB 12.1  HCT 39.0  PLT 372   ------------------------------------------------------------------------------------------------------------------  Chemistries  Recent Labs  Lab 09/19/19 1722 09/20/19 0211 09/21/19 0530  NA 140 139  --   K 3.6 3.3*  --   CL 99 104  --   CO2 29 29  --   GLUCOSE 109* 108*  --   BUN 11 10  --   CREATININE 0.44 0.38* 0.39*  CALCIUM 9.0 8.2*  --   AST 19  --   --   ALT 15  --   --   ALKPHOS 94  --   --   BILITOT 0.5  --   --    ------------------------------------------------------------------------------------------------------------------  Cardiac Enzymes No results for input(s): TROPONINI in the last 168 hours. ------------------------------------------------------------------------------------------------------------------  RADIOLOGY:  Dg Lumbar Spine 2-3 Views  Result Date: 09/20/2019 CLINICAL DATA:  Closed compression fracture L1 vertebra EXAM: LUMBAR SPINE - 2-3 VIEW COMPARISON:  08/16/2019 FINDINGS: Severe compression fracture L1 has progressed significantly since the prior study. Mild retrolisthesis of bone into the canal. Osteopenia. No other fractures. Suboptimal lateral view which is oblique. Normal alignment. No pars defect. Disc spaces maintained throughout the lumbar spine IMPRESSION: Severe compression fracture L1 with significant progression since 08/16/2019. Mild retropulsion of bone into the canal. Recommend CT lumbar spine for further evaluation of the spinal canal. Electronically Signed   By: Franchot Gallo M.D.   On: 09/20/2019 15:48   Ct Head Wo Contrast  Result Date: 09/19/2019 CLINICAL DATA:  Altered  level consciousness, EXAM: CT HEAD WITHOUT CONTRAST TECHNIQUE: Contiguous axial images were obtained from the base of the skull through the vertex without intravenous contrast. COMPARISON:  March 16, 2009 FINDINGS: Brain: No evidence of  acute territorial infarction, hemorrhage, hydrocephalus,extra-axial collection or mass lesion/mass effect. There is dilatation the ventricles and sulci consistent with age-related atrophy. Low-attenuation changes in the deep white matter consistent with small vessel ischemia. Vascular: No hyperdense vessel or unexpected calcification. Skull: The skull is intact. No fracture or focal lesion identified. Sinuses/Orbits: The visualized paranasal sinuses and mastoid air cells are clear. The orbits and globes intact. Other: None IMPRESSION: 1.  No acute intracranial abnormality. 2. Findings consistent with age related atrophy and chronic small vessel ischemia Electronically Signed   By: Prudencio Pair M.D.   On: 09/19/2019 20:48   Dg Chest Portable 1 View  Result Date: 09/19/2019 CLINICAL DATA:  Patient with history of back fracture. EXAM: PORTABLE CHEST 1 VIEW COMPARISON:  Rib series 03/07/2009 FINDINGS: Cardiac contours upper limits of normal. Small bilateral pleural effusions, right-greater-than-left. Right basilar consolidation. Biapical pleuroparenchymal thickening left greater than right. Thoracic spine degenerative changes. Monitoring leads overlie the patient. IMPRESSION: Patchy consolidation within the right lower lung with right-greater-than-left pleural effusions. Opacities may represent atelectasis or infection. Electronically Signed   By: Lovey Newcomer M.D.   On: 09/19/2019 18:37    ASSESSMENT AND PLAN:   Active Problems:   A-fib ([place])  1.    Aspiration pneumonia-dysphagia Initially started on Vanco and cefepime but vancomycin stopped with negative MRSA. Swallow evaluation is done-suggested to have significant aspirations and keep n.p.o.  If patient has some improvement then she might suggest to do a barium swallow study after the weekend. I also called palliative care for further discussion of goals of care.  2.  A. fib with RVR which is new for this patient Started on Cardizem drip.  Cannot  switch to oral as patient is n.p.o. currently.   She is not a good candidate for anticoagulation due to fall risk Normal EF as per echocardiogram.  If her heart rate is not controlled consider cardiology input  3.  Recent L1 fracture continue brace as prescribed  Called orthopedic consult for further guidance.  4.  Miscellaneous Lovenox for DVT prophylaxis  5.  Hypokalemia-replace IV.  All the records are reviewed and case discussed with Care Management/Social Workerr. Management plans discussed with the patient, family and they are in agreement.  CODE STATUS: DNR  TOTAL TIME TAKING CARE OF THIS PATIENT: *45 minutes.   Discussed with patient's daughter in the room.  POSSIBLE D/C IN 2-3 DAYS, DEPENDING ON CLINICAL CONDITION.   Vaughan Basta M.D on 09/21/2019   Between 7am to 6pm - Pager - [phone]  After 6pm go to [email or web address] - password [email or web address] [facility]  Office  [phone]  CC: Primary care physician; Einar Pheasant, MD  Note: This dictation was prepared with Dragon dictation along with smaller phrase technology. Any transcriptional errors that result from this process are unintentional.

## 2019-09-21 NOTE — Plan of Care (Signed)
  Problem: Clinical Measurements: Goal: Ability to maintain clinical measurements within normal limits will improve Outcome: Progressing Goal: Cardiovascular complication will be avoided Outcome: Progressing Note: Patient HR controlled with cardizem gtt

## 2019-09-22 LAB — CBC
HCT: 38.9 % (ref 36.0–46.0)
Hemoglobin: 12.1 g/dL (ref 12.0–15.0)
MCH: 27.4 pg (ref 26.0–34.0)
MCHC: 31.1 g/dL (ref 30.0–36.0)
MCV: 88 fL (ref 80.0–100.0)
Platelets: 406 10*3/uL — ABNORMAL HIGH (ref 150–400)
RBC: 4.42 MIL/uL (ref 3.87–5.11)
RDW: 14.3 % (ref 11.5–15.5)
WBC: 9.9 10*3/uL (ref 4.0–10.5)
nRBC: 0 % (ref 0.0–0.2)

## 2019-09-22 LAB — BASIC METABOLIC PANEL
Anion gap: 14 (ref 5–15)
BUN: 7 mg/dL — ABNORMAL LOW (ref 8–23)
CO2: 21 mmol/L — ABNORMAL LOW (ref 22–32)
Calcium: 8.1 mg/dL — ABNORMAL LOW (ref 8.9–10.3)
Chloride: 106 mmol/L (ref 98–111)
Creatinine, Ser: 0.37 mg/dL — ABNORMAL LOW (ref 0.44–1.00)
GFR calc Af Amer: >60 mL/min (ref >60)
GFR calc non Af Amer: >60 mL/min (ref >60)
Glucose, Bld: 63 mg/dL — ABNORMAL LOW (ref 70–99)
Potassium: 3 mmol/L — ABNORMAL LOW (ref 3.5–5.1)
Sodium: 141 mmol/L (ref 135–145)

## 2019-09-22 MED ORDER — POTASSIUM CHLORIDE IN NACL 40-0.9 MEQ/L-% IV SOLN: INTRAVENOUS | Status: DC

## 2019-09-22 MED ORDER — SODIUM CHLORIDE 0.9 % IV SOLN
3.0000 g | Freq: Two times a day (BID) | INTRAVENOUS | Status: DC
  Administered 2019-09-22 – 2019-09-23 (×2): 3 g via INTRAVENOUS
  Filled 2019-09-22: qty 8
  Filled 2019-09-22 (×2): qty 3

## 2019-09-22 MED ORDER — KCL IN DEXTROSE-NACL 40-5-0.9 MEQ/L-%-% IV SOLN
INTRAVENOUS | Status: DC
  Administered 2019-09-22 – 2019-09-23 (×2): via INTRAVENOUS
  Filled 2019-09-22 (×3): qty 1000

## 2019-09-22 NOTE — Progress Notes (Signed)
[facility] [place] NAME: Denise Garrison    MR#:  [id number]  DATE OF BIRTH:  [birth year]  SUBJECTIVE:  CHIEF COMPLAINT:   Chief Complaint  Patient presents with  . Weakness   Came with significant weakness and daughter has noticed some choking episodes at home in last few days. Patient was admitted to hospital for vertebral fracture last month and was sent home with brace.  She went to rehab for 2 3 weeks where she had some recovery but has been not much mobile since came home for 1 week. Patient is more talking today but is disoriented about place and details. REVIEW OF SYSTEMS:  Due to dementia and old age with altered mental status patient is not able to give reliable review of system. ROS  DRUG ALLERGIES:  No Known Allergies  VITALS:  Blood pressure (!) 127/57, pulse 80, temperature 98.3 F (36.8 C), temperature source Oral, resp. rate 18, height 5\' 5"  ([id number] m), weight 44 kg, SpO2 93 %.  PHYSICAL EXAMINATION:  GENERAL:  85 y.o.-year-old thin patient lying in the bed with no acute distress.  EYES: Pupils equal, round, reactive to light and accommodation. No scleral icterus. Extraocular muscles intact.  HEENT: Head atraumatic, normocephalic. Oropharynx and nasopharynx clear.  NECK:  Supple, no jugular venous distention. No thyroid enlargement, no tenderness.  LUNGS: Decreased breath sounds bilaterally, no wheezing, rales,rhonchi or crepitation. No use of accessory muscles of respiration.  CARDIOVASCULAR: S1, S2 normal. No murmurs, rubs, or gallops.  ABDOMEN: Soft, nontender, nondistended. Bowel sounds present. No organomegaly or mass.   EXTREMITIES: No pedal edema, cyanosis, or clubbing.  NEUROLOGIC: Cranial nerves II through XII are intact. Muscle strength 2-3/5 in all extremities. Sensation intact. Gait not checked.  PSYCHIATRIC: The patient is alert and oriented x 0.  SKIN: No obvious rash, lesion, or ulcer.   Physical  Exam LABORATORY PANEL:   CBC Recent Labs  Lab 09/22/19 0537  WBC 9.9  HGB 12.1  HCT 38.9  PLT 406*   ------------------------------------------------------------------------------------------------------------------  Chemistries  Recent Labs  Lab 09/19/19 1722  09/22/19 0537  NA 140   < > 141  K 3.6   < > 3.0*  CL 99   < > 106  CO2 29   < > 21*  GLUCOSE 109*   < > 63*  BUN 11   < > 7*  CREATININE 0.44   < > 0.37*  CALCIUM 9.0   < > 8.1*  AST 19  --   --   ALT 15  --   --   ALKPHOS 94  --   --   BILITOT 0.5  --   --    < > = values in this interval not displayed.   ------------------------------------------------------------------------------------------------------------------  Cardiac Enzymes No results for input(s): TROPONINI in the last 168 hours. ------------------------------------------------------------------------------------------------------------------  RADIOLOGY:  Dg Lumbar Spine 2-3 Views  Result Date: 09/20/2019 CLINICAL DATA:  Closed compression fracture L1 vertebra EXAM: LUMBAR SPINE - 2-3 VIEW COMPARISON:  08/16/2019 FINDINGS: Severe compression fracture L1 has progressed significantly since the prior study. Mild retrolisthesis of bone into the canal. Osteopenia. No other fractures. Suboptimal lateral view which is oblique. Normal alignment. No pars defect. Disc spaces maintained throughout the lumbar spine IMPRESSION: Severe compression fracture L1 with significant progression since 08/16/2019. Mild retropulsion of bone into the canal. Recommend CT lumbar spine for further evaluation of the spinal canal. Electronically Signed   By: Jorja Loa.D.  On: 09/20/2019 15:48    ASSESSMENT AND PLAN:   Active Problems:   A-fib ([place])  1.    Aspiration pneumonia-dysphagia Initially started on Vanco and cefepime but vancomycin stopped with negative MRSA. Swallow evaluation is done-suggested to have significant aspirations and keep n.p.o.  If patient has  some improvement then she might suggest to do a barium swallow study after the weekend. I also called palliative care for further discussion of goals of care. As patient is now more awake and talkative, I would get reevaluation by swallow technician now before starting any oral diet or medication.  2.  A. fib with RVR which is new for this patient Started on Cardizem drip.  Cannot switch to oral as patient is n.p.o. currently.   She is not a good candidate for anticoagulation due to fall risk Normal EF as per echocardiogram.  I will wait for swallow evaluation again and if she is allowed to have oral meds then will start on some.  3.  Recent L1 fracture continue brace as prescribed  Called orthopedic consult for further guidance. After talking to orthopedic doctor, daughter chose conservative management and keep patient comfortable but no surgery.  4.  Miscellaneous Lovenox for DVT prophylaxis  5.  Hypokalemia-replace IV.  6.  Hyperglycemia Blood sugar level was around 60 in today morning's blood samples. She is n.p.o. so I will change IV fluid with dextrose.  All the records are reviewed and case discussed with Care Management/Social Workerr. Management plans discussed with the patient, family and they are in agreement.  CODE STATUS: DNR  TOTAL TIME TAKING CARE OF THIS PATIENT: 35 minutes.    POSSIBLE D/C IN 2-3 DAYS, DEPENDING ON CLINICAL CONDITION.   Vaughan Basta M.D on 09/22/2019   Between 7am to 6pm - Pager - [phone]  After 6pm go to [email or web address] - password [email or web address] [facility]  Office  [phone]  CC: Primary care physician; Einar Pheasant, MD  Note: This dictation was prepared with Dragon dictation along with smaller phrase technology. Any transcriptional errors that result from this process are unintentional.

## 2019-09-23 DIAGNOSIS — Z515 Encounter for palliative care: Secondary | ICD-10-CM

## 2019-09-23 DIAGNOSIS — Z7189 Other specified counseling: Secondary | ICD-10-CM

## 2019-09-23 LAB — BASIC METABOLIC PANEL
Anion gap: 10 (ref 5–15)
BUN: <5 mg/dL — ABNORMAL LOW (ref 8–23)
CO2: 23 mmol/L (ref 22–32)
Calcium: 8.2 mg/dL — ABNORMAL LOW (ref 8.9–10.3)
Chloride: 108 mmol/L (ref 98–111)
Creatinine, Ser: 0.39 mg/dL — ABNORMAL LOW (ref 0.44–1.00)
GFR calc Af Amer: >60 mL/min (ref >60)
GFR calc non Af Amer: >60 mL/min (ref >60)
Glucose, Bld: 107 mg/dL — ABNORMAL HIGH (ref 70–99)
Potassium: 3.1 mmol/L — ABNORMAL LOW (ref 3.5–5.1)
Sodium: 141 mmol/L (ref 135–145)

## 2019-09-23 LAB — MAGNESIUM: Magnesium: 2 mg/dL (ref 1.7–2.4)

## 2019-09-23 MED ORDER — DILTIAZEM 12 MG/ML ORAL SUSPENSION
60.0000 mg | Freq: Three times a day (TID) | ORAL | Status: DC
  Filled 2019-09-23 (×2): qty 6

## 2019-09-23 MED ORDER — ENOXAPARIN SODIUM 40 MG/0.4ML ~~LOC~~ SOLN
40.0000 mg | SUBCUTANEOUS | Status: DC
  Administered 2019-09-24: 40 mg via SUBCUTANEOUS
  Filled 2019-09-23: qty 0.4

## 2019-09-23 MED ORDER — POTASSIUM CHLORIDE 20 MEQ PO PACK
40.0000 meq | PACK | Freq: Once | ORAL | Status: AC
  Administered 2019-09-23: 40 meq via ORAL
  Filled 2019-09-23: qty 2

## 2019-09-23 MED ORDER — SODIUM CHLORIDE 0.9 % IV SOLN
3.0000 g | Freq: Four times a day (QID) | INTRAVENOUS | Status: DC
  Administered 2019-09-23 – 2019-09-24 (×3): 3 g via INTRAVENOUS
  Filled 2019-09-23: qty 8
  Filled 2019-09-23 (×3): qty 3
  Filled 2019-09-23 (×3): qty 8

## 2019-09-23 MED ORDER — DILTIAZEM HCL 30 MG PO TABS
60.0000 mg | ORAL_TABLET | Freq: Three times a day (TID) | ORAL | Status: DC
  Administered 2019-09-23 – 2019-09-26 (×10): 60 mg via ORAL
  Filled 2019-09-23 (×10): qty 2

## 2019-09-23 NOTE — Consult Note (Signed)
Consultation Note Date: 09/23/2019   Patient Name: Denise Garrison  DOB: May 25, 1923  MRN: [id number]  Age / Sex: 85 y.o., female  PCP: Einar Pheasant, MD Referring Physician: Vaughan Basta, *  Reason for Consultation: Establishing goals of care  HPI/Patient Profile:  Denise Garrison  is a 85 y.o. female with a known history of B12 deficiency, hyperlipidemia who was brought to the hospital with generalized weakness.  Patient was recently at a rehab facility and sent to assisted living.  According to the daughter who is at bedside states that her mother when she saw her 2 days ago could not walk and has not been acting herself.  Clinical Assessment and Goals of Care: Patient is resting in bed. She is confused. Daughter to bedside. Patient has 1 other child, a son. Both children are equal HPOAs.   Prior to her previous admission, Ms. Maisano walked with a cane. She could navigate her facility without assistance. She could do ADL's. Following her fall, she was placed in rehab. Per daughter, she is a different person cognitively and physically. She has problems answering questions. She requires assistance with all ADL's, and is no longer independent. She discusses her husband and his end of life process. She is worried that he suffered, and she would not want this for her mother. She states she would likely not be able to take her home, and her brother is newly diagnosed with Parkinson's and likely would not be able to care for her at home.    We discussed her diagnosis, prognosis, GOC, EOL wishes disposition and options.  A detailed discussion was had today regarding advanced directives.  Concepts specific to code status, artifical feeding and hydration, IV antibiotics and rehospitalization were discussed.  The difference between an aggressive medical intervention path and a comfort care path was discussed.   Values and goals of care important to patient and family were attempted to be elicited.  Discussed limitations of medical interventions to prolong quality of life in some situations and discussed the concept of human mortality.  Concern for poor oral intake, and dysphagia diet secondary to aspiration PNA.   SUMMARY OF RECOMMENDATIONS   Family conversation tomorrow with both HPOA's.   Prognosis:   Poor overall  Discharge Planning: To Be Determined      Primary Diagnoses: Present on Admission: . A-fib [facility])   I have reviewed the medical record, interviewed the patient and family, and examined the patient. The following aspects are pertinent.  Past Medical History:  Diagnosis Date  . B12 deficiency   . Cancer [facility])    history of melanoma, s/p excision x 3  . Cavernous hemangioma    History of  . Hiatal hernia    History of  . Hx of detached retina repair   . Hypercholesterolemia   . Osteoporosis    Social History   Socioeconomic History  . Marital status: Widowed    Spouse name: Not on file  . Number of children: 2  . Years of education: Not  on file  . Highest education level: Not on file  Occupational History  . Not on file  Social Needs  . Financial resource strain: Patient refused  . Food insecurity    Worry: Patient refused    Inability: Patient refused  . Transportation needs    Medical: Patient refused    Non-medical: Patient refused  Tobacco Use  . Smoking status: Never Smoker  . Smokeless tobacco: Never Used  Substance and Sexual Activity  . Alcohol use: Yes    Comment: occasional wine, rare  . Drug use: No  . Sexual activity: Not Currently  Lifestyle  . Physical activity    Days per week: Patient refused    Minutes per session: Patient refused  . Stress: Patient refused  Relationships  . Social Herbalist on phone: Patient refused    Gets together: Patient refused    Attends religious service: Patient refused    Active member of  club or organization: Patient refused    Attends meetings of clubs or organizations: Patient refused    Relationship status: Patient refused  Other Topics Concern  . Not on file  Social History Narrative   She is a widow ,has two children one son and one daughter.  She resided at [place] History  Problem Relation Age of Onset  . Kidney disease Father   . Heart disease Father   . Kidney disease Brother   . Parkinson's disease Brother   . Melanoma Brother   . Breast cancer Neg Hx   . Colon cancer Neg Hx    Scheduled Meds: . aspirin EC  81 mg Oral Daily  . cholecalciferol  5,000 Units Oral Daily  . diltiazem  60 mg Oral Q8H  . [START ON 09/24/2019] enoxaparin (LOVENOX) injection  40 mg Subcutaneous Q24H   Continuous Infusions: . sodium chloride Stopped (09/23/19 1458)  . ampicillin-sulbactam (UNASYN) IV    . dextrose 5 % and 0.9 % NaCl with KCl 40 mEq/L 50 mL/hr at 09/23/19 1249   PRN Meds:.sodium chloride, acetaminophen **OR** acetaminophen, ondansetron **OR** ondansetron (ZOFRAN) IV Medications Prior to Admission:  Prior to Admission medications   Medication Sig Start Date End Date Taking? Authorizing Provider  Cholecalciferol (D 5000) 125 MCG (5000 UT) capsule Take 5,000 Units by mouth daily.    Yes [provider]  cyanocobalamin (,VITAMIN B-12,) 1000 MCG/ML injection Inject 1,000 mcg into the muscle every 30 (thirty) days.   Yes [provider]  acetaminophen (TYLENOL) 325 MG tablet Take 2 tablets (650 mg total) by mouth every 6 (six) hours as needed for mild pain (or Fever >/= 101). Patient not taking: Reported on 09/19/2019 08/21/19   Dustin Flock, MD  docusate sodium (COLACE) 100 MG capsule Take 1 capsule (100 mg total) by mouth 2 (two) times daily. Patient not taking: Reported on 09/19/2019 08/21/19   Dustin Flock, MD   No Known Allergies Review of Systems  Unable to perform ROS   Physical Exam Pulmonary:     Effort: Pulmonary effort  is normal.  Neurological:     Mental Status: She is alert.     Vital Signs: BP 128/63 (BP Location: Left Arm)   Pulse 100   Temp 98.1 F (36.7 C) (Oral)   Resp 19   Ht 5\' 5"  (1.651 m)   Wt 48.1 kg   SpO2 97%   BMI 17.64 kg/m  Pain Scale: 0-10 POSS *See Group Information*: 1-Acceptable,Awake and alert Pain Score:  0-No pain   SpO2: SpO2: 97 % O2 Device:SpO2: 97 % O2 Flow Rate: .O2 Flow Rate (L/min): 2 L/min  IO: Intake/output summary:   Intake/Output Summary (Last 24 hours) at 09/23/2019 1606 Last data filed at 09/23/2019 [id number] Gross per 24 hour  Intake 1319.1 ml  Output -  Net 1319.1 ml    LBM: Last BM Date: (UTA) Baseline Weight: Weight: 54.4 kg Most recent weight: Weight: 48.1 kg     Palliative Assessment/Data:     Time In: 3:05 Time Out: 4:15 Time Total: 70 min Greater than 50%  of this time was spent counseling and coordinating care related to the above assessment and plan.  Signed by: Asencion Gowda, NP   Please contact Palliative Medicine Team phone at [phone] for questions and concerns.  For individual provider: See Shea Evans

## 2019-09-23 NOTE — Progress Notes (Signed)
[facility] [place] NAME: Denise Garrison    MR#:  [id number]  DATE OF BIRTH:  [birth year]  SUBJECTIVE:  CHIEF COMPLAINT:   Chief Complaint  Patient presents with  . Weakness   Came with significant weakness and daughter has noticed some choking episodes at home in last few days. Patient was admitted to hospital for vertebral fracture last month and was sent home with brace.  She went to rehab for 2 3 weeks where she had some recovery but has been not much mobile since came home for 1 week. Patient is more talking today but is disoriented about place and details. Was able to tolerate some p.o. food trial by mouth with swallow technician. REVIEW OF SYSTEMS:  Due to dementia and old age with altered mental status patient is not able to give reliable review of system. ROS  DRUG ALLERGIES:  No Known Allergies  VITALS:  Blood pressure 128/63, pulse 100, temperature 98.1 F (36.7 C), temperature source Oral, resp. rate 19, height 5\' 5"  (1.651 m), weight 48.1 kg, SpO2 97 %.  PHYSICAL EXAMINATION:  GENERAL:  85 y.o.-year-old thin patient lying in the bed with no acute distress.  EYES: Pupils equal, round, reactive to light and accommodation. No scleral icterus. Extraocular muscles intact.  HEENT: Head atraumatic, normocephalic. Oropharynx and nasopharynx clear.  NECK:  Supple, no jugular venous distention. No thyroid enlargement, no tenderness.  LUNGS: Decreased breath sounds bilaterally, no wheezing, rales,rhonchi or crepitation. No use of accessory muscles of respiration.  CARDIOVASCULAR: S1, S2 normal. No murmurs, rubs, or gallops.  ABDOMEN: Soft, nontender, nondistended. Bowel sounds present. No organomegaly or mass.   EXTREMITIES: No pedal edema, cyanosis, or clubbing.  NEUROLOGIC: Cranial nerves II through XII are intact. Muscle strength 2-3/5 in all extremities. Sensation intact. Gait not checked.  PSYCHIATRIC: The patient is alert and  oriented x 0.  SKIN: No obvious rash, lesion, or ulcer.   Physical Exam LABORATORY PANEL:   CBC Recent Labs  Lab 09/22/19 0537  WBC 9.9  HGB 12.1  HCT 38.9  PLT 406*   ------------------------------------------------------------------------------------------------------------------  Chemistries  Recent Labs  Lab 09/19/19 1722  09/23/19 0637  NA 140   < > 141  K 3.6   < > 3.1*  CL 99   < > 108  CO2 29   < > 23  GLUCOSE 109*   < > 107*  BUN 11   < > <5*  CREATININE 0.44   < > 0.39*  CALCIUM 9.0   < > 8.2*  MG  --   --  2.0  AST 19  --   --   ALT 15  --   --   ALKPHOS 94  --   --   BILITOT 0.5  --   --    < > = values in this interval not displayed.   ------------------------------------------------------------------------------------------------------------------  Cardiac Enzymes No results for input(s): TROPONINI in the last 168 hours. ------------------------------------------------------------------------------------------------------------------  RADIOLOGY:  No results found.  ASSESSMENT AND PLAN:   Active Problems:   A-fib ([place])  1.    Aspiration pneumonia-dysphagia Initially started on Vanco and cefepime but vancomycin stopped with negative MRSA. Swallow evaluation is done-suggested to have significant aspirations and keep n.p.o.  If patient has some improvement then she might suggest to do a barium swallow study after the weekend. I also called palliative care for further discussion of goals of care. Today swallow evaluation is done and suggested to  start her on some pured or dysphagia diet.  2.  A. fib with RVR which is new for this patient Started on Cardizem drip.  Cannot switch to oral as patient is n.p.o. currently.   She is not a good candidate for anticoagulation due to fall risk Normal EF as per echocardiogram.  As patient passed swallow evaluation now I will switch to oral Cardizem.  3.  Recent L1 fracture continue brace as prescribed   Called orthopedic consult for further guidance. After talking to orthopedic doctor, daughter chose conservative management and keep patient comfortable but no surgery.  4.  Miscellaneous Lovenox for DVT prophylaxis  5.  Hypokalemia-replace IV.  6.  Hyperglycemia She is n.p.o. so I will change IV fluid with dextrose.  All the records are reviewed and case discussed with Care Management/Social Workerr. Management plans discussed with the patient, family and they are in agreement.  CODE STATUS: DNR  TOTAL TIME TAKING CARE OF THIS PATIENT: 35 minutes.  Palliative care nurse is having meeting tomorrow with patient's daughter and son. Daughter feels that without having good oral intake and having significant progressive decline over last 1 month patient does not have good prognosis and she is agreeing for involving hospice in the care.  POSSIBLE D/C IN 2-3 DAYS, DEPENDING ON CLINICAL CONDITION. I spoke to patient's daughter on phone today.  Vaughan Basta M.D on 09/23/2019   Between 7am to 6pm - Pager - [phone]  After 6pm go to [email or web address] - password [email or web address] [facility]  Office  [phone]  CC: Primary care physician; Einar Pheasant, MD  Note: This dictation was prepared with Dragon dictation along with smaller phrase technology. Any transcriptional errors that result from this process are unintentional.

## 2019-09-23 NOTE — Care Management Important Message (Signed)
Important Message  Patient Details  Name: Denise Garrison. Jeck MRN: [id number] Date of Birth: 18-Jul-1923   Medicare Important Message Given:  Yes     Dannette Barbara 09/23/2019, 1:40 PM

## 2019-09-23 NOTE — Progress Notes (Signed)
Speech Language Pathology Treatment: Dysphagia  Patient Details Name: Denise Garrison MRN: [id number] DOB: 1923/11/19 Today's Date: 09/23/2019 Time: [id number] SLP Time Calculation (min) (ACUTE ONLY): 55 min  Assessment / Plan / Recommendation Clinical Impression  Pt seen today for ongoing assessment of pt's swallowing function; toleration of oral diet of least restrictive consistency. Pt is much more awake, engaging w/ others in short phrase conversation. Mod-Severe cognitive decline noted; confusion in responses and conversation but she was aware of how to feed self by holding Cup to drink.  Pt appears to present w/ Mod+ oropharyngeal phase dysphagia w/ the trials assessed at this evalution today. Pt was not given trials of solid foods d/t declined Cognitive status/Dementia and missing Dentition. Pt required Min-mod. Verbal/visual/tactile cues for initiation of follow through w/ aspiration precautions and was given frequent reminders during trials --- suspect impact from baseline Cognitive decline/Dementia. Pt does present w/ increased risk for aspiration w/ oral intake at this time but this is reduced w/ Supervision and Support of a modified diet consistency.  Pt consumed trials of ice chips then Nectar consistency liquids via Cup w/ no immediate, overt s/s of aspiration; no decline in vocal quality or respiratory status during/post trials. Oral phase was [facility] for bolus management and A-P transfer w/ trials of Nectar liquids and purees; min slower bolus clearing intermittently as pt often "munched" on the puree trials, but this was fully achieved given time. No trials of solids were assessed at this eval d/t mental status decline, missing dentition. Trials of thin liquids and ice chips attempted at end of session; intermittently delayed throat clearing and a mild cough followed trials of thin liquids via Cup -- pt often talked immediately following the swallow. Pt's Dtr stated this is the same  coughing she has witnesses as well.  Speech was more intelligible w/ more intent and energy today. Pt will follow through w/ tasks given cues per NSG report. Pt required full Supervision and support(verbal/visual cues) during the po trials today but overall improved from initial evaluation. Education and practice w/ pt on sipping from Cup taking small amounts at a time -- pt often did this on her own w/ the Nectar liquids. Encouraged her to NOT talk so much/quickly after po sips of liquids, however, pt demonstrated reduced follow through w/ instruction(baseline Cognitive decline). Updated pt's Dtr on pt's presentation at session today w/ toleration of po trials; modified diet. Recommend a dysphagia level 1 (Puree) diet w/ Nectar consistency liquids -- allow pt to hold Cup herself but support w/ eating foods; aspiration precautions; Pills Crushed in Puree for safer swallowing; feeding support/supervision. ST services to f/u w/ pt's toleration of diet; objective swallowing assessment as deemed necessary by MD in light of pt's overall GOC/POC w/ Palliative services involved.      HPI HPI: Pt is a 85 y.o. female with PMH as noted below including recent L1 fracture(08/2019) who presents to the ED essentially for inability to take care of herself adequately with her current level of care.  Per EMS and paperwork, she was discharged from [facility] rehabilitation facility 2 days ago to [facility] which is an independent living facility.  To be able to live at [facility], she would need to be able to complete her ADLs independently, however the patient has been unable to do so over these last 2 days and requires assistance with ambulation even with a walker.  She also has apparently had an unwitnessed fall a few days ago.  Pt  has a medical history including Closed compression fracture of L1 vertebra and at the same time of a UTI; baseline dx of Cognitive dysfunction.  CXR: Patchy consolidation within the right lower  lung with right-greater-than-left pleural effusions.       SLP Plan  Continue with current plan of care       Recommendations  Diet recommendations: Dysphagia 1 (puree);Nectar-thick liquid Liquids provided via: Cup;No straw Medication Administration: Crushed with puree(for easier, safer swallowing) Supervision: Patient able to self feed;Staff to assist with self feeding;Full supervision/cueing for compensatory strategies(pt can hold cup to drink) Compensations: Minimize environmental distractions;Slow rate;Small sips/bites;Lingual sweep for clearance of pocketing;Multiple dry swallows after each bite/sip;Follow solids with liquid Postural Changes and/or Swallow Maneuvers: Seated upright 90 degrees;Upright 30-60 min after meal                General recommendations: (Dietician f/u -- drink supplements) Oral Care Recommendations: Oral care BID;Staff/trained caregiver to provide oral care Follow up Recommendations: Skilled Nursing facility(TBD) SLP Visit Diagnosis: Dysphagia, oropharyngeal phase (R13.12)(impact from baseline Cognitive decline) Plan: Continue with current plan of care       [place], [place], CCC-SLP Ritaj Dullea 09/23/2019, 3:42 PM

## 2019-09-23 NOTE — Progress Notes (Signed)
PHARMACY NOTE:  ANTIMICROBIAL RENAL DOSAGE ADJUSTMENT  Current antimicrobial regimen includes a mismatch between antimicrobial dosage and estimated renal function.  As per policy approved by the Pharmacy & Therapeutics and Medical Executive Committees, the antimicrobial dosage will be adjusted accordingly.  Current antimicrobial dosage:  Unasyn 3 gm IV q12h  Indication: Aspiration PNA  Renal Function:  Estimated Creatinine Clearance: 32.7 mL/min (A) (by C-G formula based on SCr of 0.39 mg/dL (L)). CRCL now > 30 ml/min    Antimicrobial dosage has been changed to:   Unasyn 3gm IV q6h  Additional comments:   Thank you for allowing pharmacy to be a part of this patient's care.  Cicilia Clinger A, [facility] 09/23/2019 9:47 AM

## 2019-09-23 NOTE — Progress Notes (Signed)
Spoke with daughter on the phone to update her on any overnight information. I will continue to assess.

## 2019-09-23 NOTE — Progress Notes (Signed)
PHARMACIST - PHYSICIAN COMMUNICATION  CONCERNING:  Enoxaparin (Lovenox) for DVT Prophylaxis    RECOMMENDATION: Patient was prescribed enoxaprin 30mg  q24 hours for VTE prophylaxis.   Filed Weights   09/20/19 0120 09/21/19 0405 09/23/19 0507  Weight: 106 lb 9.6 oz (48.4 kg) 96 lb 14.4 oz (44 kg) 106 lb (48.1 kg)    Body mass index is 17.64 kg/m.  Estimated Creatinine Clearance: 32.7 mL/min (A) (by C-G formula based on SCr of 0.39 mg/dL (L)).  Patient is candidate for enoxaparin 40mg  every 24 hours based on Weight now > 45 kg  DESCRIPTION: Pharmacy has adjusted enoxaparin dose.   Patient is now receiving enoxaparin 40mg  every 24 hours.  Noralee Space, PharmD, BCPS Clinical Pharmacist 09/23/2019 9:41 AM

## 2019-09-24 MED ORDER — SODIUM CHLORIDE 0.9% FLUSH: 3.0000 mL | INTRAVENOUS | Status: DC | PRN

## 2019-09-24 MED ORDER — LORAZEPAM 1 MG PO TABS: 1.0000 mg | ORAL_TABLET | ORAL | Status: DC | PRN

## 2019-09-24 MED ORDER — ACETAMINOPHEN 325 MG PO TABS: 650.0000 mg | ORAL_TABLET | Freq: Four times a day (QID) | ORAL | Status: DC | PRN

## 2019-09-24 MED ORDER — SODIUM CHLORIDE 0.9% FLUSH
3.0000 mL | Freq: Two times a day (BID) | INTRAVENOUS | Status: DC
  Administered 2019-09-24 – 2019-09-25 (×3): 3 mL via INTRAVENOUS

## 2019-09-24 MED ORDER — LORAZEPAM 2 MG/ML PO CONC
1.0000 mg | ORAL | Status: DC | PRN
  Filled 2019-09-24: qty 0.5

## 2019-09-24 MED ORDER — MORPHINE SULFATE (PF) 2 MG/ML IV SOLN: 1.0000 mg | INTRAVENOUS | Status: DC | PRN

## 2019-09-24 MED ORDER — GLYCOPYRROLATE 0.2 MG/ML IJ SOLN
0.2000 mg | INTRAMUSCULAR | Status: DC | PRN
  Filled 2019-09-24: qty 1

## 2019-09-24 MED ORDER — POLYVINYL ALCOHOL 1.4 % OP SOLN
1.0000 [drp] | Freq: Four times a day (QID) | OPHTHALMIC | Status: DC | PRN
  Filled 2019-09-24: qty 15

## 2019-09-24 MED ORDER — ONDANSETRON HCL 4 MG/2ML IJ SOLN: 4.0000 mg | Freq: Four times a day (QID) | INTRAMUSCULAR | Status: DC | PRN

## 2019-09-24 MED ORDER — HALOPERIDOL LACTATE 5 MG/ML IJ SOLN: 0.5000 mg | INTRAMUSCULAR | Status: DC | PRN

## 2019-09-24 MED ORDER — HALOPERIDOL 0.5 MG PO TABS
0.5000 mg | ORAL_TABLET | ORAL | Status: DC | PRN
  Filled 2019-09-24: qty 1

## 2019-09-24 MED ORDER — ONDANSETRON 4 MG PO TBDP
4.0000 mg | ORAL_TABLET | Freq: Four times a day (QID) | ORAL | Status: DC | PRN
  Filled 2019-09-24: qty 1

## 2019-09-24 MED ORDER — ACETAMINOPHEN 650 MG RE SUPP: 650.0000 mg | Freq: Four times a day (QID) | RECTAL | Status: DC | PRN

## 2019-09-24 MED ORDER — LORAZEPAM 2 MG/ML IJ SOLN: 0.5000 mg | INTRAMUSCULAR | Status: DC | PRN

## 2019-09-24 MED ORDER — HALOPERIDOL LACTATE 2 MG/ML PO CONC
0.5000 mg | ORAL | Status: DC | PRN
  Filled 2019-09-24: qty 0.3

## 2019-09-24 MED ORDER — BIOTENE DRY MOUTH MT LIQD: 15.0000 mL | OROMUCOSAL | Status: DC | PRN

## 2019-09-24 MED ORDER — GLYCOPYRROLATE 1 MG PO TABS
1.0000 mg | ORAL_TABLET | ORAL | Status: DC | PRN
  Filled 2019-09-24: qty 1

## 2019-09-24 MED ORDER — NEPRO/CARBSTEADY PO LIQD: 237.0000 mL | Freq: Two times a day (BID) | ORAL | Status: DC

## 2019-09-24 NOTE — Progress Notes (Signed)
New referral for [facility] hospice home received from [place]. Randall Hiss and family aware there are currently no beds available at the hospice home. Will gather patient information and follow up with the family in the morning.  Flo Shanks [place], [place] [phone]

## 2019-09-24 NOTE — Progress Notes (Signed)
Speech Language Pathology Treatment: Dysphagia  Patient Details Name: Denise Garrison. [place] MRN: [id number] DOB: 05-Jun-1923 Today's Date: 09/24/2019 Time: [id number] SLP Time Calculation (min) (ACUTE ONLY): 35 min  Assessment / Plan / Recommendation Clinical Impression  Denise Garrison seen today for ongoing assessment of Denise Garrison's swallowing function; toleration of oral diet of least restrictive consistency. Denise Garrison upgraded to oral diet of Dysphagia level 1 (puree) w/ Nectar consistency liquids yesterday after presenting w/ adequate toleration of the po trials w/ no overt, clinical s/s of aspiration w/ the trials. She continues to present w/ a more awake State, engaging w/ others in short phrase conversation. Mod-Severe cognitive decline noted; confusion in responses and conversation but she was aware of how to feed self by holding Cup to drink. She often talked about her vision stating, "I can't see" and rubbed her Left eye. MD updated.  Denise Garrison appears to present w/ Mod+ oropharyngeal phase dysphagia overall w/ increased risk for aspiration thus Pulmonary decline from such. Risk is reduced w/ Supervision and Support of a modified diet consistency.  Denise Garrison required Min-mod. Verbal/visual/tactile cues for initiation of follow through w/ aspiration precautions and was given frequent reminders during trials --- suspect impact from baseline Cognitive decline/Dementia.  Denise Garrison consumed trials of Nectar consistency liquids via Cup (Denise Garrison holding to drink) w/ no immediate, overt s/s of aspiration; no immediate decline in vocal quality or respiratory status during/post trials. Oral phase was grossly [facility] for bolus management and A-P transfer w/ trials of Nectar liquids and purees; slight-min slower bolus clearing noted intermittently w/ the puree foods as Denise Garrison often "munched" on the these trials, but oral clearing was fully achieved given time. Denise Garrison continues to talk immediately following the swallow of trials which can increase risk for aspiration. Denise Garrison will  follow through w/ tasks given cues; full Supervision and support(verbal/visual cues) during the po trials is necessary for safety w/ oral intake d/t her baseline Cognitive decline. Education and practice w/ Denise Garrison on sipping from Cup taking small amounts at a time -- Denise Garrison often did this on her own w/ the Nectar liquids. Encouraged her to NOT talk so much/quickly after po sips of liquids, however, Denise Garrison demonstrated reduced follow through w/ instruction(baseline Cognitive decline).  Recommend a dysphagia level 1 (Puree) diet w/ Nectar consistency liquids -- allow Denise Garrison to hold Cup herself but support w/ eating foods; foods of increased texture are not recommended at this time d/t declined Cognitive status/Dementia, illness, and missing Dentition. Recommend aspiration precautions; Pills Crushed in Puree for safer swallowing; FULL feeding support/supervision. ST services to f/u w/ Denise Garrison's toleration of diet; objective swallowing assessment as deemed necessary by MD in light of Denise Garrison's overall GOC/POC w/ Palliative services involved.     HPI HPI: Denise Garrison with PMH as noted below including recent L1 fracture(08/2019) who presents to the ED essentially for inability to take care of herself adequately with her current level of care.  Per EMS and paperwork, she was discharged from [facility] rehabilitation facility 2 days ago to [facility] which is an independent living facility.  To be able to live at [facility], she would need to be able to complete her ADLs independently, however the patient has been unable to do so over these last 2 days and requires assistance with ambulation even with a walker.  She also has apparently had an unwitnessed fall a few days ago.  Denise Garrison has a medical history including Closed compression fracture of L1 vertebra and at the same time  of a UTI; baseline dx of Cognitive dysfunction.  CXR: Patchy consolidation within the right lower lung with right-greater-than-left pleural effusions.        SLP Plan  Continue with current plan of care       Recommendations  Diet recommendations: Dysphagia 1 (puree);Nectar-thick liquid Liquids provided via: Cup;No straw Medication Administration: Crushed with puree(for safer swallowing) Supervision: Patient able to self feed;Staff to assist with self feeding;Full supervision/cueing for compensatory strategies Compensations: Minimize environmental distractions;Slow rate;Small sips/bites;Lingual sweep for clearance of pocketing;Multiple dry swallows after each bite/sip;Follow solids with liquid Postural Changes and/or Swallow Maneuvers: Seated upright 90 degrees;Upright 30-60 min after meal                General recommendations: (dietician f/u; Palliative care f/u) Oral Care Recommendations: Oral care BID;Staff/trained caregiver to provide oral care Follow up Recommendations: Skilled Nursing facility(TBD) SLP Visit Diagnosis: Dysphagia, oropharyngeal phase (R13.12)(baseline Cognitive decline/Dementia) Plan: Continue with current plan of care       [name], [name], CCC-SLP Watson,Katherine 09/24/2019, 10:37 AM

## 2019-09-24 NOTE — Progress Notes (Signed)
Daily Progress Note   Patient Name: Denise Garrison       Date: 09/24/2019 DOB: 15-Jan-1923  Age: 85 y.o. MRN#: [id number] Attending Physician: Vaughan Basta, * Primary Care Physician: Einar Pheasant, MD Admit Date: 09/19/2019  Reason for Consultation/Follow-up: Establishing goals of care  Subjective: Patient is resting in bed. She is confused, but pleasant and cooperative. Patient's son and daughter who are her HPOA's are at bedside. They state they have talked and want to ensure a good QOL for their mother and do not want her to suffer.   We discussed her diet and oral intake. Patient received D5% drip during admission for hypoglycemia while NPO awaiting swallow eval. Patient ate bites of lunch yesterday. Daughter had picture on her phone of dinner yesterday where she ate bites of magic cup. Today during our meeting, lunch was served, and patient ate bites of magic cup and a couple of bites of another item on the tray. She did cough with eating the magic cup.  The siblings would like for their mother to eat and drink what she wants without restrictions though she could develop aspiration PNA, and her time could be reduced. The patient complaints about throat dryness and does not wish to wear her O2 any longer.    The family would like to proceed with comfort care and transition her to the hospice facility for end of life care.  I completed a MOST form today and the signed original was placed in the chart. A photocopy was placed in the chart to be scanned into EMR. The patient's children outlined their wishes for the following treatment decisions:  Cardiopulmonary Resuscitation: Do Not Attempt Resuscitation (DNR/No CPR)  Medical Interventions: Comfort Measures: Keep clean, warm, and  dry. Use medication by any route, positioning, wound care, and other measures to relieve pain and suffering. Use oxygen, suction and manual treatment of airway obstruction as needed for comfort. Do not transfer to the hospital unless comfort needs cannot be met in current location.  Antibiotics: No antibiotics (use other measures to relieve symptoms)  IV Fluids: No IV fluids (provide other measures to ensure comfort)  Feeding Tube: No feeding tube     Length of Stay: 5  Current Medications: Scheduled Meds:  . aspirin EC  81 mg Oral Daily  . cholecalciferol  5,000  Units Oral Daily  . diltiazem  60 mg Oral Q8H  . enoxaparin (LOVENOX) injection  40 mg Subcutaneous Q24H  . feeding supplement (NEPRO CARB STEADY)  237 mL Oral BID BM    Continuous Infusions: . sodium chloride 250 mL (09/24/19 0536)  . ampicillin-sulbactam (UNASYN) IV 3 g (09/24/19 0537)  . dextrose 5 % and 0.9 % NaCl with KCl 40 mEq/L 50 mL/hr at 09/24/19 0300    PRN Meds: sodium chloride, acetaminophen **OR** acetaminophen, ondansetron **OR** ondansetron (ZOFRAN) IV  Physical Exam Pulmonary:     Effort: Pulmonary effort is normal.  Neurological:     Mental Status: She is alert.     Comments: Confused             Vital Signs: BP (!) 114/54 (BP Location: Right Arm)   Pulse 94   Temp 98.2 F (36.8 C) (Oral)   Resp 16   Ht 5' 5"  (1.651 m)   Wt 52.2 kg   SpO2 90%   BMI 19.14 kg/m  SpO2: SpO2: 90 % O2 Device: O2 Device: Nasal Cannula O2 Flow Rate: O2 Flow Rate (L/min): 3 L/min  Intake/output summary:   Intake/Output Summary (Last 24 hours) at 09/24/2019 1320 Last data filed at 09/24/2019 0300 Gross per 24 hour  Intake 1110.28 ml  Output -  Net 1110.28 ml   LBM: Last BM Date: (uta) Baseline Weight: Weight: 54.4 kg Most recent weight: Weight: 52.2 kg       Palliative Assessment/Data: 30%      Patient Active Problem List   Diagnosis Date Noted  . UTI (urinary tract infection) 08/18/2019  .  Bilateral renal masses 08/18/2019  . Closed compression fracture of L1 vertebra ([place]) 08/18/2019  . Vitamin D deficiency 07/05/2017  . A-fib ([place]) 01/08/2017  . Thyroid fullness 03/09/2014  . Hypercholesterolemia 11/11/2012  . Osteoporosis 11/11/2012  . B12 deficiency 11/11/2012  . Cognitive dysfunction 11/11/2012    Palliative Care Assessment & Plan    Recommendations/Plan:  Shifting to comfort care. Recommend hospice facility placement.     Code Status:    Code Status Orders  (From admission, onward)         Start     Ordered   09/20/19 0119  Do not attempt resuscitation (DNR)  Continuous    Question Answer Comment  In the event of cardiac or respiratory ARREST Do not call a "code blue"   In the event of cardiac or respiratory ARREST Do not perform Intubation, CPR, defibrillation or ACLS   In the event of cardiac or respiratory ARREST Use medication by any route, position, wound care, and other measures to relive pain and suffering. May use oxygen, suction and manual treatment of airway obstruction as needed for comfort.      09/20/19 0118        Code Status History    Date Active Date Inactive Code Status Order ID Comments User Context   08/20/2019 1256 08/21/2019 1857 DNR [id number]  Dustin Flock, MD Inpatient   08/18/2019 2143 08/20/2019 1256 Full Code [id number]  Lance Coon, MD Inpatient   Advance Care Planning Activity    Advance Directive Documentation     Most Recent Value  Type of Advance Directive  Healthcare Power of Attorney, Living will  Pre-existing out of facility DNR order (yellow form or pink MOST form)  -  "MOST" Form in Place?  -       Prognosis:   < 2 weeks Poor PO intake. Hypoglycemia requiring D5%  drip during admission. Aspiration PNA. Family would like comfort feeds. Patient does not want to wear O2. Family wants to stop abx.    Discharge Planning:  Hospice facility   Thank you for allowing the Palliative Medicine Team to assist in  the care of this patient.   Time In: 12:00 Time Out: 1:30 Total Time 90 min Prolonged Time Billed  yes      Greater than 50%  of this time was spent counseling and coordinating care related to the above assessment and plan.  Asencion Gowda, NP  Please contact Palliative Medicine Team phone at [phone] for questions and concerns.

## 2019-09-24 NOTE — TOC Initial Note (Signed)
Transition of Care (TOC) - Initial/Assessment Note    Patient Details  Name: Denise Garrison MRN: [id number] Date of Birth: 1923/07/18  Transition of Care [facility]) CM/SW Contact:    Ross Ludwig, LCSW Phone Number: 09/24/2019, 4:29 PM  Clinical Narrative:                  Patient is a 85 year old female who is alert and oriented x1.  Patient has dementia, patient's daughter was at bedside and assisted with assessment.  Patient was at [facility] for short term rehab, then she discharged back to [facility].  Patient's daughter has met with palliative and they have decided to switch patient to comfort care.  Patient's family would like her to go to the hospice facility instead due to patient's deterioration.  CSW spoke to patient and her daughter, and provided choice of hospice facilities, and they chose [facility] in [place].  CSW contacted Santiago Glad at [facility] to make the referral.  Per Santiago Glad there are currently no beds available.  CSW updated patient's family.  Expected Discharge Plan: [place] Barriers to Discharge: Hospice Bed not available   Patient Goals and CMS Choice Patient states their goals for this hospitalization and ongoing recovery are:: Patient's family would like to have patient go to hospice facility. CMS Medicare.gov Compare Post Acute Care list provided to:: Patient Represenative (must comment) Choice offered to / list presented to : Adult Children  Expected Discharge Plan and Services Expected Discharge Plan: [place] Choice: Hospice Living arrangements for the past 2 months: [place], [place]                                      Prior Living Arrangements/Services Living arrangements for the past 2 months: [place], [place] Lives with:: Facility Resident Patient language and need for interpreter  reviewed:: Yes Do you feel safe going back to the place where you live?: No   Patient and family would like to go to the hospice facility.  Need for Family Participation in Patient Care: Yes (Comment) Care giver support system in place?: No (comment)   Criminal Activity/Legal Involvement Pertinent to Current Situation/Hospitalization: No - Comment as needed  Activities of Daily Living Home Assistive Devices/Equipment: Wheelchair, Environmental consultant (specify type) ADL Screening (condition at time of admission) Patient's cognitive ability adequate to safely complete daily activities?: No Is the patient deaf or have difficulty hearing?: No Does the patient have difficulty seeing, even when wearing glasses/contacts?: No Does the patient have difficulty concentrating, remembering, or making decisions?: Yes Patient able to express need for assistance with ADLs?: Yes Does the patient have difficulty dressing or bathing?: Yes Independently performs ADLs?: No Communication: Needs assistance Dressing (OT): Needs assistance Is this a change from baseline?: Pre-admission baseline Grooming: Needs assistance Is this a change from baseline?: Pre-admission baseline Bathing: Needs assistance Is this a change from baseline?: Pre-admission baseline Toileting: Needs assistance Is this a change from baseline?: Pre-admission baseline In/Out Bed: Needs assistance Is this a change from baseline?: Pre-admission baseline Walks in Home: Needs assistance Is this a change from baseline?: Pre-admission baseline Does the patient have difficulty walking or climbing stairs?: Yes Weakness of Legs: Both Weakness of Arms/Hands: Both  Permission Sought/Granted   Permission granted to share information with : Yes, Verbal  Permission Granted  Share Information with NAME: Dannette Barbara Daughter [phone]  Permission granted to share info w AGENCY: [facility]        Emotional Assessment Appearance::  Appears stated age Attitude/Demeanor/Rapport: Engaged Affect (typically observed): Accepting, Calm, Appropriate, Pleasant Orientation: : Oriented to Self Alcohol / Substance Use: Not Applicable Psych Involvement: No (comment)  Admission diagnosis:  Weakness [R53.1] Healthcare-associated pneumonia [J18.9] Patient Active Problem List   Diagnosis Date Noted  . UTI (urinary tract infection) 08/18/2019  . Bilateral renal masses 08/18/2019  . Closed compression fracture of L1 vertebra ([place]) 08/18/2019  . Vitamin D deficiency 07/05/2017  . A-fib ([place]) 01/08/2017  . Thyroid fullness 03/09/2014  . Hypercholesterolemia 11/11/2012  . Osteoporosis 11/11/2012  . B12 deficiency 11/11/2012  . Cognitive dysfunction 11/11/2012   PCP:  Einar Pheasant, MD Pharmacy:   [place], [place] - [address] [place] [address] Phone: [phone] Fax: [phone]     Social Determinants of Health (SDOH) Interventions    Readmission Risk Interventions No flowsheet data found.

## 2019-09-24 NOTE — Progress Notes (Signed)
[place] [facility] [place] NAME: Denise Garrison    MR#:  [id number]  DATE OF BIRTH:  Apr 29, 1923  SUBJECTIVE:  CHIEF COMPLAINT:   Chief Complaint  Patient presents with  . Weakness   Came with significant weakness and daughter has noticed some choking episodes at home in last few days. Patient was admitted to hospital for vertebral fracture last month and was sent home with brace.  She went to rehab for 2 3 weeks where she had some recovery but has been not much mobile since came home for 1 week. Patient is more talking today but is disoriented about place and details. Was able to tolerate some p.o. food trial by mouth with swallow technician. REVIEW OF SYSTEMS:  Due to dementia and old age with altered mental status patient is not able to give reliable review of system. ROS  DRUG ALLERGIES:  No Known Allergies  VITALS:  Blood pressure (!) 114/54, pulse 94, temperature 98.2 F (36.8 C), temperature source Oral, resp. rate 16, height _0  (1.651 m), weight 52.2 kg, SpO2 90 %.  PHYSICAL EXAMINATION:  GENERAL:  85 y.o.-year-old thin patient lying in the bed with no acute distress.  EYES: Pupils equal, round, reactive to light and accommodation. No scleral icterus. Extraocular muscles intact.  HEENT: Head atraumatic, normocephalic. Oropharynx and nasopharynx clear.  NECK:  Supple, no jugular venous distention. No thyroid enlargement, no tenderness.  LUNGS: Decreased breath sounds bilaterally, no wheezing, rales,rhonchi or crepitation. No use of accessory muscles of respiration.  CARDIOVASCULAR: S1, S2 normal. No murmurs, rubs, or gallops.  ABDOMEN: Soft, nontender, nondistended. Bowel sounds present. No organomegaly or mass.   EXTREMITIES: No pedal edema, cyanosis, or clubbing.  NEUROLOGIC: Cranial nerves II through XII are intact. Muscle strength 2-3/5 in all extremities. Sensation intact. Gait not checked.  PSYCHIATRIC: The patient is alert and  oriented x 0.  SKIN: No obvious rash, lesion, or ulcer.   Physical Exam LABORATORY PANEL:   CBC Recent Labs  Lab 09/22/19 0537  WBC 9.9  HGB 12.1  HCT 38.9  PLT 406*   ------------------------------------------------------------------------------------------------------------------  Chemistries  Recent Labs  Lab 09/19/19 1722  09/23/19 0637  NA 140   < > 141  K 3.6   < > 3.1*  CL 99   < > 108  CO2 29   < > 23  GLUCOSE 109*   < > 107*  BUN 11   < > <5*  CREATININE 0.44   < > 0.39*  CALCIUM 9.0   < > 8.2*  MG  --   --  2.0  AST 19  --   --   ALT 15  --   --   ALKPHOS 94  --   --   BILITOT 0.5  --   --    < > = values in this interval not displayed.   ------------------------------------------------------------------------------------------------------------------  Cardiac Enzymes No results for input(s): TROPONINI in the last 168 hours. ------------------------------------------------------------------------------------------------------------------  RADIOLOGY:  No results found.  ASSESSMENT AND PLAN:   Active Problems:   A-fib ([place])  1.    Aspiration pneumonia-dysphagia Initially started on Vanco and cefepime but vancomycin stopped with negative MRSA. Swallow evaluation is done-suggested to have significant aspirations and keep n.p.o.  If patient has some improvement then she might suggest to do a barium swallow study after the weekend. I also called palliative care for further discussion of goals of care. Today swallow evaluation is done and suggested  to start her on some pured or dysphagia diet. Patient is not having good oral intake and so her prognosis remains very poor. Palliative care met with patient's family and they have decided on comfort measures and admission to hospice home.  2.  A. fib with RVR which is new for this patient Started on Cardizem drip.  Cannot switch to oral as patient is n.p.o. currently.   She is not a good candidate for  anticoagulation due to fall risk Normal EF as per echocardiogram.  As patient passed swallow evaluation now I will switch to oral Cardizem. Now patient is on comfort care and awaiting for hospice home.  3.  Recent L1 fracture continue brace as prescribed  Called orthopedic consult for further guidance. After talking to orthopedic doctor, daughter chose conservative management and keep patient comfortable but no surgery.  4.  Miscellaneous Lovenox for DVT prophylaxis  5.  Hypokalemia-replace IV.  6.  Hypoglycemia She is n.p.o. so I will change IV fluid with dextrose. Now she is on comfort care and waiting for hospice.  All the records are reviewed and case discussed with Care Management/Social Workerr. Management plans discussed with the patient, family and they are in agreement.  CODE STATUS: DNR  TOTAL TIME TAKING CARE OF THIS PATIENT: 35 minutes.  Patient is converted to comfort care and hospice home transfer is requested by palliative care after discussing and meeting with family.  POSSIBLE D/C IN 2-3 DAYS, DEPENDING ON CLINICAL CONDITION.   Vaughan Basta M.D on 09/24/2019   Between 7am to 6pm - Pager - [phone]  After 6pm go to [email or web address] - password [email or web address] [facility]  Office  [phone]  CC: Primary care physician; Einar Pheasant, MD  Note: This dictation was prepared with Dragon dictation along with smaller phrase technology. Any transcriptional errors that result from this process are unintentional.

## 2019-09-24 NOTE — Progress Notes (Signed)
Physical Therapy Treatment Patient Details Name: Denise Garrison. Arrant MRN: [id number] DOB: Sep 26, 1923 Today's Date: 09/24/2019    History of Present Illness 85 y.o. female with a known history of B12 deficiency, hyperlipidemia who was brought to the hospital with generalized weakness.  Patient was recently at a rehab facility ([facility]) and sent to assisted living ([facility]). She arrived to hospital ED by EMS on 09/19/2019 and admitted with A.fib with RVS and HCAP. Recent L1 fracture with [place] hyperextehnsion TLSO prescribed to wear when OOB.    PT Comments    Pt continues to be confused and functionally limited.  She showed goof effort when PT was able to get her on task, however pt needed constant redirection and encouragement t/o the effort.  Pt had issue of bladder incontinency in standing up so longer bout of ambulation deferred, however she likely could not have done any more than the ~8 ft she did on the last PT attempt.     Follow Up Recommendations  SNF;Supervision/Assistance - 24 hour     Equipment Recommendations  Rolling walker with 5" wheels;3in1 (PT)    Recommendations for Other Services       Precautions / Restrictions Precautions Precautions: Fall Precaution Comments: no Flexion. TLSO brace when sitting and OOB Required Braces or Orthoses: Spinal Brace Restrictions Weight Bearing Restrictions: No    Mobility  Bed Mobility Overal bed mobility: Needs Assistance Bed Mobility: Supine to Sit     Supine to sit: Mod assist     General bed mobility comments: Pt again able to initiate rolling in bed but needed considerable assist to get to sitting and to done TLSO  Transfers Overall transfer level: Needs assistance Equipment used: Rolling walker (2 wheeled) Transfers: Sit to/from Stand Sit to Stand: Mod assist         General transfer comment: Patient transfered sit <> stand with RW and Min A bed to chair and chair to chair. Required cuing for and  placement and for adequate forward shift. Failed to reach back when sitting. Required step by step cuing. Pt lost control of bladder on standing.  Ambulation/Gait Ambulation/Gait assistance: Min Web designer (Feet): 3 Feet Assistive device: Rolling walker (2 wheeled)       General Gait Details: Pt able to take a few hesitant and unsteady steps to get to recliner, deferred further ambulation 2/2 bladder incontenence and need for clean up; regardless pt likely could not have done much more this date based on her need for assist and confusion   Stairs             Wheelchair Mobility    Modified Rankin (Stroke Patients Only)       Balance Overall balance assessment: Needs assistance Sitting-balance support: Bilateral upper extremity supported;Feet supported Sitting balance-Leahy Scale: Fair     Standing balance support: Bilateral upper extremity supported;During functional activity Standing balance-Leahy Scale: Poor Standing balance comment: Patient dependend on BUE support on RW                            Cognition Arousal/Alertness: Awake/alert Behavior During Therapy: Anxious Overall Cognitive Status: No family/caregiver present to determine baseline cognitive functioning                                 General Comments: Similar to last PT session pt was confused and unable to recall even  very recent cuing/answers/etc      Exercises General Exercises - Lower Extremity Ankle Circles/Pumps: AROM;10 reps Heel Slides: AROM;Strengthening;10 reps Hip ABduction/ADduction: Strengthening;AROM;10 reps    General Comments        Pertinent Vitals/Pain Pain Assessment: (no c/o pain, so facial indication on pain) Faces Pain Scale: No hurt    Home Living Family/patient expects to be discharged to:: Unsure             Home Equipment: Kasandra Knudsen - single point      Prior Function Level of Independence: Independent with assistive  device(s)      Comments: Pateint unable to report. Confused.   PT Goals (current goals can now be found in the care plan section) Progress towards PT goals: Progressing toward goals    Frequency    Min 2X/week      PT Plan Current plan remains appropriate    Co-evaluation              AM-PAC PT "6 Clicks" Mobility   Outcome Measure  Help needed turning from your back to your side while in a flat bed without using bedrails?: A Little Help needed moving from lying on your back to sitting on the side of a flat bed without using bedrails?: A Lot Help needed moving to and from a bed to a chair (including a wheelchair)?: A Lot Help needed standing up from a chair using your arms (e.g., wheelchair or bedside chair)?: A Little Help needed to walk in hospital room?: A Lot Help needed climbing 3-5 steps with a railing? : Total 6 Click Score: 13    End of Session Equipment Utilized During Treatment: Gait belt;Other (comment)(TLSO) Activity Tolerance: Patient limited by fatigue Patient left: in chair;with call bell/phone within reach;with chair alarm set Nurse Communication: Mobility status PT Visit Diagnosis: Unsteadiness on feet (R26.81);Muscle weakness (generalized) (M62.81);Difficulty in walking, not elsewhere classified (R26.2)     Time: [id number] PT Time Calculation (min) (ACUTE ONLY): 25 min  Charges:  $Gait Training: 8-22 mins $Therapeutic Exercise: 8-22 mins                     Kreg Shropshire, DPT 09/24/2019, 11:17 AM

## 2019-09-24 NOTE — Plan of Care (Signed)
  Problem: Clinical Measurements: Goal: Diagnostic test results will improve Outcome: Progressing Goal: Respiratory complications will improve Outcome: Progressing   Problem: Elimination: Goal: Will not experience complications related to urinary retention Outcome: Progressing   Problem: Safety: Goal: Ability to remain free from injury will improve Outcome: Progressing   Problem: Skin Integrity: Goal: Risk for impaired skin integrity will decrease Outcome: Progressing

## 2019-09-25 NOTE — TOC Progression Note (Addendum)
Transition of Care (TOC) - Progression Note    Patient Details  Name: Denise Garrison MRN: [id number] Date of Birth: [birth year]  Transition of Care [facility]) CM/SW Contact  Cecil Cobbs Phone Number: 09/25/2019, 6:27 PM  Clinical Narrative:     CSW was informed by patient's daughter that they would like to go to [facility].  CSW contacted [facility] facility and spoke to [place], and they can accept patient tomorrow. CSW to continue to follow patient's progress throughout discharge planning.  CSW updated Santiago Glad from [employer]   Expected Discharge Plan: [place] Barriers to Discharge: Hospice Bed not available  Expected Discharge Plan and Services Expected Discharge Plan: [place]: Hospice Living arrangements for the past 2 months: [place], [place]                                       Social Determinants of Health (SDOH) Interventions    Readmission Risk Interventions No flowsheet data found.

## 2019-09-25 NOTE — Progress Notes (Signed)
Daily Progress Note   Patient Name: Denise Garrison       Date: 09/25/2019 DOB: 1923/12/04  Age: 85 y.o. MRN#: [id number] Attending Physician: Vaughan Basta, * Primary Care Physician: Einar Pheasant, MD Admit Date: 09/19/2019  Reason for Consultation/Follow-up: Establishing goals of care  Subjective: Patient is resting in bed. She denies complaint. She does not want to wear oxygen and has removed it from her nose prior to my entry. Would not attempt to replace cannula if she does not wish to wear it for comfort, as she is currently comfort care. Awaiting bed at hospice facility.   Length of Stay: 6  Current Medications: Scheduled Meds:  . diltiazem  60 mg Oral Q8H  . sodium chloride flush  3 mL Intravenous Q12H    Continuous Infusions:   PRN Meds: acetaminophen **OR** acetaminophen, antiseptic oral rinse, glycopyrrolate **OR** glycopyrrolate **OR** glycopyrrolate, haloperidol **OR** haloperidol **OR** haloperidol lactate, LORazepam **OR** LORazepam **OR** LORazepam, morphine injection, [DISCONTINUED] ondansetron **OR** ondansetron (ZOFRAN) IV, ondansetron **OR** [DISCONTINUED] ondansetron (ZOFRAN) IV, polyvinyl alcohol, sodium chloride flush  Physical Exam Pulmonary:     Effort: Pulmonary effort is normal.  Skin:    General: Skin is warm and dry.  Neurological:     Mental Status: She is alert.             Vital Signs: BP 129/84 (BP Location: Left Arm)   Pulse 90   Temp 97.7 F (36.5 C) (Oral)   Resp (!) 21   Ht 5\' 5"  (1.651 m)   Wt 50.7 kg   SpO2 (!) 88%   BMI 18.60 kg/m  SpO2: SpO2: (!) 88 % O2 Device: O2 Device: Nasal Cannula O2 Flow Rate: O2 Flow Rate (L/min): 1 L/min  Intake/output summary:  No intake or output data in the 24 hours ending 09/25/19  1033 LBM: Last BM Date: (uta) Baseline Weight: Weight: 54.4 kg Most recent weight: Weight: 50.7 kg       Palliative Assessment/Data: 30%      Patient Active Problem List   Diagnosis Date Noted  . UTI (urinary tract infection) 08/18/2019  . Bilateral renal masses 08/18/2019  . Closed compression fracture of L1 vertebra ([place]) 08/18/2019  . Vitamin D deficiency 07/05/2017  . A-fib ([place]) 01/08/2017  . Thyroid fullness 03/09/2014  . Hypercholesterolemia 11/11/2012  . Osteoporosis  11/11/2012  . B12 deficiency 11/11/2012  . Cognitive dysfunction 11/11/2012    Palliative Care Assessment & Plan    Recommendations/Plan:  Shifting to comfort care. Recommend hospice facility placement.     Code Status:    Code Status Orders  (From admission, onward)         Start     Ordered   09/20/19 0119  Do not attempt resuscitation (DNR)  Continuous    Question Answer Comment  In the event of cardiac or respiratory ARREST Do not call a "code blue"   In the event of cardiac or respiratory ARREST Do not perform Intubation, CPR, defibrillation or ACLS   In the event of cardiac or respiratory ARREST Use medication by any route, position, wound care, and other measures to relive pain and suffering. May use oxygen, suction and manual treatment of airway obstruction as needed for comfort.      09/20/19 0118        Code Status History    Date Active Date Inactive Code Status Order ID Comments User Context   08/20/2019 1256 08/21/2019 1857 DNR [id number]  Dustin Flock, MD Inpatient   08/18/2019 2143 08/20/2019 1256 Full Code ID:[id number]  Lance Coon, MD Inpatient   Advance Care Planning Activity    Advance Directive Documentation     Most Recent Value  Type of Advance Directive  Healthcare Power of Attorney, Living will  Pre-existing out of facility DNR order (yellow form or pink MOST form)  -  "MOST" Form in Place?  -       Prognosis:   < 2 weeks Poor PO intake. Hypoglycemia  requiring D5% drip during admission. Aspiration PNA. Family would like comfort feeds. Patient does not want to wear O2. Family wants to stop abx.    Discharge Planning:  Hospice facility   Thank you for allowing the Palliative Medicine Team to assist in the care of this patient.   Total Time 15 min Prolonged Time Billed no      Greater than 50%  of this time was spent counseling and coordinating care related to the above assessment and plan.  Asencion Gowda, NP  Please contact Palliative Medicine Team phone at [phone] for questions and concerns.

## 2019-09-25 NOTE — Progress Notes (Signed)
[facility] [place] NAME: Denise Garrison    MR#:  [id number]  DATE OF BIRTH:  1923-11-04  SUBJECTIVE:  CHIEF COMPLAINT:   Chief Complaint  Patient presents with  . Weakness   Came with significant weakness and daughter has noticed some choking episodes at home in last few days. Patient was admitted to hospital for vertebral fracture last month and was sent home with brace.  She went to rehab for 2 3 weeks where she had some recovery but has been not much mobile since came home for 1 week. Patient is more talking today but is disoriented about place and details. Was able to tolerate some p.o. food trial by mouth with swallow technician. REVIEW OF SYSTEMS:  Due to dementia and old age with altered mental status patient is not able to give reliable review of system. ROS  DRUG ALLERGIES:  No Known Allergies  VITALS:  Blood pressure 129/84, pulse 90, temperature 97.7 F (36.5 C), temperature source Oral, resp. rate (!) 21, height 5' 5" (1.651 m), weight 50.7 kg, SpO2 (!) 88 %.  PHYSICAL EXAMINATION:  GENERAL:  85 y.o.-year-old thin patient lying in the bed with no acute distress.  EYES: Pupils equal, round, reactive to light and accommodation. No scleral icterus. Extraocular muscles intact.  HEENT: Head atraumatic, normocephalic. Oropharynx and nasopharynx clear.  NECK:  Supple, no jugular venous distention. No thyroid enlargement, no tenderness.  LUNGS: Decreased breath sounds bilaterally, no wheezing, rales,rhonchi or crepitation. No use of accessory muscles of respiration.  CARDIOVASCULAR: S1, S2 normal. No murmurs, rubs, or gallops.  ABDOMEN: Soft, nontender, nondistended. Bowel sounds present. No organomegaly or mass.   EXTREMITIES: No pedal edema, cyanosis, or clubbing.  NEUROLOGIC: Cranial nerves II through XII are intact. Muscle strength 2-3/5 in all extremities. Sensation intact. Gait not checked.  PSYCHIATRIC: The patient is alert  and oriented x 0.  SKIN: No obvious rash, lesion, or ulcer.   Physical Exam LABORATORY PANEL:   CBC Recent Labs  Lab 09/22/19 0537  WBC 9.9  HGB 12.1  HCT 38.9  PLT 406*   ------------------------------------------------------------------------------------------------------------------  Chemistries  Recent Labs  Lab 09/19/19 1722  09/23/19 0637  NA 140   < > 141  K 3.6   < > 3.1*  CL 99   < > 108  CO2 29   < > 23  GLUCOSE 109*   < > 107*  BUN 11   < > <5*  CREATININE 0.44   < > 0.39*  CALCIUM 9.0   < > 8.2*  MG  --   --  2.0  AST 19  --   --   ALT 15  --   --   ALKPHOS 94  --   --   BILITOT 0.5  --   --    < > = values in this interval not displayed.   ------------------------------------------------------------------------------------------------------------------  Cardiac Enzymes No results for input(s): TROPONINI in the last 168 hours. ------------------------------------------------------------------------------------------------------------------  RADIOLOGY:  No results found.  ASSESSMENT AND PLAN:   Active Problems:   A-fib ([place])  1.    Aspiration pneumonia-dysphagia Initially started on Vanco and cefepime but vancomycin stopped with negative MRSA. Swallow evaluation is done-suggested to have significant aspirations and keep n.p.o.  If patient has some improvement then she might suggest to do a barium swallow study after the weekend. I also called palliative care for further discussion of goals of care. Today swallow evaluation is done and  suggested to start her on some pured or dysphagia diet. Patient is not having good oral intake and so her prognosis remains very poor. Palliative care met with patient's family and they have decided on comfort measures and admission to hospice home.  Awaited bed at hospice home.  Patient remains without any discomfort.  2.  A. fib with RVR which is new for this patient Started on Cardizem drip.  Cannot switch to  oral as patient is n.p.o. currently.   She is not a good candidate for anticoagulation due to fall risk Normal EF as per echocardiogram.  As patient passed swallow evaluation now I will switch to oral Cardizem. Now patient is on comfort care and awaiting for hospice home.  3.  Recent L1 fracture continue brace as prescribed  Called orthopedic consult for further guidance. After talking to orthopedic doctor, daughter chose conservative management and keep patient comfortable but no surgery.  4.  Miscellaneous Lovenox for DVT prophylaxis  5.  Hypokalemia-replace IV.  6.  Hypoglycemia She is n.p.o. so I will change IV fluid with dextrose. Now she is on comfort care and waiting for hospice.  All the records are reviewed and case discussed with Care Management/Social Workerr. Management plans discussed with the patient, family and they are in agreement.  CODE STATUS: DNR  TOTAL TIME TAKING CARE OF THIS PATIENT: 25 minutes.  Patient is converted to comfort care and hospice home transfer is requested by palliative care after discussing and meeting with family.  POSSIBLE D/C IN 1-2 DAYS, DEPENDING ON CLINICAL CONDITION.     M.D on 09/25/2019   Between 7am to 6pm - Pager - [phone]  After 6pm go to [email or web address] - password [email or web address]  [facility]  Office  [phone]  CC: Primary care physician; Scott, Charlene, MD  Note: This dictation was prepared with Dragon dictation along with smaller phrase technology. Any transcriptional errors that result from this process are unintentional.  

## 2019-09-26 MED ORDER — MORPHINE SULFATE (CONCENTRATE) 10 MG/0.5ML PO SOLN: 10.0000 mg | ORAL | 0 refills | Status: AC | PRN

## 2019-09-26 MED ORDER — LORAZEPAM 2 MG/ML PO CONC: 1.0000 mg | ORAL | 0 refills | Status: AC | PRN

## 2019-09-26 MED ORDER — ONDANSETRON 4 MG PO TBDP: 4.0000 mg | ORAL_TABLET | Freq: Four times a day (QID) | ORAL | 0 refills | Status: AC | PRN

## 2019-09-26 MED ORDER — DILTIAZEM HCL 60 MG PO TABS: 60.0000 mg | ORAL_TABLET | Freq: Three times a day (TID) | ORAL | 0 refills | Status: AC

## 2019-09-26 MED ORDER — GLYCOPYRROLATE 1 MG PO TABS: 1.0000 mg | ORAL_TABLET | ORAL | 0 refills | Status: AC | PRN

## 2019-09-26 MED ORDER — ACETAMINOPHEN 325 MG PO TABS: 650.0000 mg | ORAL_TABLET | Freq: Four times a day (QID) | ORAL | 0 refills | Status: AC | PRN

## 2019-09-26 MED ORDER — ACETAMINOPHEN 650 MG RE SUPP: 650.0000 mg | Freq: Four times a day (QID) | RECTAL | 0 refills | Status: AC | PRN

## 2019-09-26 MED ORDER — HALOPERIDOL LACTATE 2 MG/ML PO CONC: 0.5000 mg | ORAL | 0 refills | Status: AC | PRN

## 2019-09-26 NOTE — TOC Progression Note (Signed)
Transition of Care (TOC) - Progression Note    Patient Details  Name: Denise Garrison MRN: [id number] Date of Birth: [birth year]  Transition [facility]) CM/SW Contact  Cecil Cobbs Phone Number: 09/26/2019, 10:38 AM  Clinical Narrative:     CSW spoke to [place] at [facility], she said they have a bed available and can accept patient today.  CSW updated patient's daughter [name] [phone] and she is in agreement to patient going to [facility] at [place].  CSW updated physician to complete the discharge summary.   Expected Discharge Plan: [place] Barriers to Discharge: Hospice Bed not available  Expected Discharge Plan and Services Expected Discharge Plan: [place] Choice: Hospice Living arrangements for the past 2 months: [place], [place]                                       Social Determinants of Health (SDOH) Interventions    Readmission Risk Interventions No flowsheet data found.

## 2019-09-26 NOTE — Discharge Summary (Signed)
[facility] [place] NAME: Denise Garrison    MR#:  [id number]  DATE OF BIRTH:  06-12-1923  DATE OF ADMISSION:  09/19/2019 ADMITTING PHYSICIAN: Dustin Flock, MD  DATE OF DISCHARGE: 09/26/2019   PRIMARY CARE PHYSICIAN: Einar Pheasant, MD    ADMISSION DIAGNOSIS:  Weakness [R53.1] Healthcare-associated pneumonia [J18.9]  DISCHARGE DIAGNOSIS:  Active Problems:   A-fib ([place])   SECONDARY DIAGNOSIS:   Past Medical History:  Diagnosis Date  . B12 deficiency   . Cancer [facility])    history of melanoma, s/p excision x 3  . Cavernous hemangioma    History of  . Hiatal hernia    History of  . Hx of detached retina repair   . Hypercholesterolemia   . Osteoporosis     HOSPITAL COURSE:   1.  Aspiration pneumonia-dysphagia Initially started on Vanco and cefepime but vancomycin stopped with negative MRSA. Swallow evaluation is done-suggested to have significant aspirations and keep n.p.o.  If patient has some improvement then she might suggest to do a barium swallow study after the weekend. I also called palliative care for further discussion of goals of care. Today swallow evaluation is done and suggested to start her on some pured or dysphagia diet. Patient is not having good oral intake and so her prognosis remains very poor. Palliative care met with patient's family and they have decided on comfort measures and admission to hospice home.  Awaited bed at hospice home.  Patient remains without any discomfort.  2.A. fib with RVR which is new for this patient Started on Cardizem drip.  Cannot switch to oral as patient is n.p.o. currently.  She is not a good candidate for anticoagulation due to fall risk Normal EF as per echocardiogram.  As patient passed swallow evaluation now I will switch to oral Cardizem. Now patient is on comfort care and awaiting for hospice home.  3.Recent L1 fracture continue brace as prescribed   Called orthopedic consult for further guidance. After talking to orthopedic doctor, daughter chose conservative management and keep patient comfortable but no surgery.  4.Miscellaneous Lovenox for DVT prophylaxis  5.  Hypokalemia-replace IV.  6.  Hypoglycemia She is n.p.o. so I will change IV fluid with dextrose. Now she is on comfort care and waiting for hospice.  DISCHARGE CONDITIONS:   Stable.  CONSULTS OBTAINED:    DRUG ALLERGIES:  No Known Allergies  DISCHARGE MEDICATIONS:   Allergies as of 09/26/2019   No Known Allergies     Medication List    STOP taking these medications   cyanocobalamin 1000 MCG/ML injection Commonly known as: (VITAMIN B-12)   D 5000 125 MCG (5000 UT) capsule Generic drug: Cholecalciferol   docusate sodium 100 MG capsule Commonly known as: COLACE     TAKE these medications   acetaminophen 325 MG tablet Commonly known as: TYLENOL Take 2 tablets (650 mg total) by mouth every 6 (six) hours as needed for mild pain (or Fever >/= 101). What changed: Another medication with the same name was added. Make sure you understand how and when to take each.   acetaminophen 650 MG suppository Commonly known as: TYLENOL Place 1 suppository (650 mg total) rectally every 6 (six) hours as needed for mild pain (or Fever >/= 101- IF can not take oral.). What changed: You were already taking a medication with the same name, and this prescription was added. Make sure you understand how and when to take each.   diltiazem 60  MG tablet Commonly known as: CARDIZEM Take 1 tablet (60 mg total) by mouth every 8 (eight) hours.   glycopyrrolate 1 MG tablet Commonly known as: ROBINUL Take 1 tablet (1 mg total) by mouth every 4 (four) hours as needed (excessive secretions).   haloperidol 2 MG/ML solution Commonly known as: HALDOL Place 0.3 mLs (0.6 mg total) under the tongue every 4 (four) hours as needed for agitation (or delirium).   LORazepam 2 MG/ML  concentrated solution Commonly known as: ATIVAN Place 0.5 mLs (1 mg total) under the tongue every 4 (four) hours as needed for anxiety.   morphine CONCENTRATE 10 MG/0.5ML Soln concentrated solution Take 0.5 mLs (10 mg total) by mouth every 3 (three) hours as needed for severe pain, anxiety or shortness of breath.   ondansetron 4 MG disintegrating tablet Commonly known as: ZOFRAN-ODT Take 1 tablet (4 mg total) by mouth every 6 (six) hours as needed for nausea.        DISCHARGE INSTRUCTIONS:    Follow with comfort care and hospice.  If you experience worsening of your admission symptoms, develop shortness of breath, life threatening emergency, suicidal or homicidal thoughts you must seek medical attention immediately by calling 911 or calling your MD immediately  if symptoms less severe.  You Must read complete instructions/literature along with all the possible adverse reactions/side effects for all the Medicines you take and that have been prescribed to you. Take any new Medicines after you have completely understood and accept all the possible adverse reactions/side effects.   Please note  You were cared for by a hospitalist during your hospital stay. If you have any questions about your discharge medications or the care you received while you were in the hospital after you are discharged, you can call the unit and asked to speak with the hospitalist on call if the hospitalist that took care of you is not available. Once you are discharged, your primary care physician will handle any further medical issues. Please note that NO REFILLS for any discharge medications will be authorized once you are discharged, as it is imperative that you return to your primary care physician (or establish a relationship with a primary care physician if you do not have one) for your aftercare needs so that they can reassess your need for medications and monitor your lab values.    Today   CHIEF  COMPLAINT:   Chief Complaint  Patient presents with  . Weakness    HISTORY OF PRESENT ILLNESS:  Denise Garrison  is a 85 y.o. female with a known history of B12 deficiency, hyperlipidemia who was brought to the hospital with generalized weakness.  Patient was recently at a rehab facility and sent to assisted living.  According to the daughter who is at bedside states that her mother when she saw her 2 days ago could not walk and has not been acting herself.  Patient was brought to the emergency room noted to have A. fib with RVR as well as pneumonia.  Her daughter reports that patient has been coughing. Patient did recently have L1 fracture.   VITAL SIGNS:  Blood pressure 124/73, pulse (!) 117, temperature 98.7 F (37.1 C), temperature source Oral, resp. rate (!) 22, height 5' 5"  (1.651 m), weight 50.4 kg, SpO2 (!) 87 %.  I/O:    Intake/Output Summary (Last 24 hours) at 09/26/2019 1125 Last data filed at 09/25/2019 2256 Gross per 24 hour  Intake -  Output 1 ml  Net -1 ml  PHYSICAL EXAMINATION:   GENERAL:  85 y.o.-year-old thin patient lying in the bed with no acute distress.  EYES: Pupils equal, round, reactive to light and accommodation. No scleral icterus. Extraocular muscles intact.  HEENT: Head atraumatic, normocephalic. Oropharynx and nasopharynx clear.  NECK:  Supple, no jugular venous distention. No thyroid enlargement, no tenderness.  LUNGS: Decreased breath sounds bilaterally, no wheezing, rales,rhonchi or crepitation. No use of accessory muscles of respiration.  CARDIOVASCULAR: S1, S2 normal. No murmurs, rubs, or gallops.  ABDOMEN: Soft, nontender, nondistended. Bowel sounds present. No organomegaly or mass.   EXTREMITIES: No pedal edema, cyanosis, or clubbing.  NEUROLOGIC: Cranial nerves II through XII are intact. Muscle strength 2-3/5 in all extremities. Sensation intact. Gait not checked.  PSYCHIATRIC: The patient is alert and oriented x 0.  SKIN: No obvious rash,  lesion, or ulcer.   DATA REVIEW:   CBC Recent Labs  Lab 09/22/19 0537  WBC 9.9  HGB 12.1  HCT 38.9  PLT 406*    Chemistries  Recent Labs  Lab 09/19/19 1722  09/23/19 0637  NA 140   < > 141  K 3.6   < > 3.1*  CL 99   < > 108  CO2 29   < > 23  GLUCOSE 109*   < > 107*  BUN 11   < > <5*  CREATININE 0.44   < > 0.39*  CALCIUM 9.0   < > 8.2*  MG  --   --  2.0  AST 19  --   --   ALT 15  --   --   ALKPHOS 94  --   --   BILITOT 0.5  --   --    < > = values in this interval not displayed.    Cardiac Enzymes No results for input(s): TROPONINI in the last 168 hours.  Microbiology Results  Results for orders placed or performed during the hospital encounter of 09/19/19  SARS Coronavirus 2 by RT PCR (hospital order, performed in [facility] hospital lab) Nasopharyngeal Nasopharyngeal Swab     Status: None   Collection Time: 09/19/19  6:16 PM   Specimen: Nasopharyngeal Swab  Result Value Ref Range Status   SARS Coronavirus 2 NEGATIVE NEGATIVE Final    Comment: (NOTE) If result is NEGATIVE SARS-CoV-2 target nucleic acids are NOT DETECTED. The SARS-CoV-2 RNA is generally detectable in upper and lower  respiratory specimens during the acute phase of infection. The lowest  concentration of SARS-CoV-2 viral copies this assay can detect is 250  copies / mL. A negative result does not preclude SARS-CoV-2 infection  and should not be used as the sole basis for treatment or other  patient management decisions.  A negative result may occur with  improper specimen collection / handling, submission of specimen other  than nasopharyngeal swab, presence of viral mutation(s) within the  areas targeted by this assay, and inadequate number of viral copies  (<250 copies / mL). A negative result must be combined with clinical  observations, patient history, and epidemiological information. If result is POSITIVE SARS-CoV-2 target nucleic acids are DETECTED. The SARS-CoV-2 RNA is generally  detectable in upper and lower  respiratory specimens dur ing the acute phase of infection.  Positive  results are indicative of active infection with SARS-CoV-2.  Clinical  correlation with patient history and other diagnostic information is  necessary to determine patient infection status.  Positive results do  not rule out bacterial infection or co-infection with other viruses. If  result is PRESUMPTIVE POSTIVE SARS-CoV-2 nucleic acids MAY BE PRESENT.   A presumptive positive result was obtained on the submitted specimen  and confirmed on repeat testing.  While 2019 novel coronavirus  (SARS-CoV-2) nucleic acids may be present in the submitted sample  additional confirmatory testing may be necessary for epidemiological  and / or clinical management purposes  to differentiate between  SARS-CoV-2 and other Sarbecovirus currently known to infect humans.  If clinically indicated additional testing with an alternate test  methodology [phone]) is advised. The SARS-CoV-2 RNA is generally  detectable in upper and lower respiratory sp ecimens during the acute  phase of infection. The expected result is Negative. Fact Sheet for Patients:  StrictlyIdeas.no Fact Sheet for Healthcare Providers: BankingDealers.co.za This test is not yet approved or cleared by the [place] FDA and has been authorized for detection and/or diagnosis of SARS-CoV-2 by FDA under an Emergency Use Authorization (EUA).  This EUA will remain in effect (meaning this test can be used) for the duration of the COVID-19 declaration under Section 564(b)(1) of the Act, 21 U.S.C. section 360bbb-3(b)(1), unless the authorization is terminated or revoked sooner. Performed at [facility], [address] [place], [place] [address]   MRSA PCR Screening     Status: None   Collection Time: 09/20/19  6:41 AM   Specimen: Nasopharyngeal  Result Value Ref Range Status   MRSA  by PCR NEGATIVE NEGATIVE Final    Comment:        The GeneXpert MRSA Assay (FDA approved for NASAL specimens only), is one component of a comprehensive MRSA colonization surveillance program. It is not intended to diagnose MRSA infection nor to guide or monitor treatment for MRSA infections. Performed at [facility], [address] [place], [place] [address]     RADIOLOGY:  No results found.  EKG:   Orders placed or performed during the hospital encounter of 09/19/19  . ED EKG  . ED EKG  . EKG 12-Lead  . EKG 12-Lead  . EKG 12-Lead  . EKG 12-Lead      Management plans discussed with the patient, family and they are in agreement.  CODE STATUS: DNR    Code Status Orders  (From admission, onward)         Start     Ordered   09/24/19 1335  Do not attempt resuscitation (DNR)  Continuous    Question Answer Comment  In the event of cardiac or respiratory ARREST Do not call a "code blue"   In the event of cardiac or respiratory ARREST Do not perform Intubation, CPR, defibrillation or ACLS   In the event of cardiac or respiratory ARREST Use medication by any route, position, wound care, and other measures to relive pain and suffering. May use oxygen, suction and manual treatment of airway obstruction as needed for comfort.   Comments MOST form in chart.      09/24/19 1336        Code Status History    Date Active Date Inactive Code Status Order ID Comments User Context   09/20/2019 0118 09/24/2019 1336 DNR [id number]  Dustin Flock, MD Inpatient   08/20/2019 1256 08/21/2019 1857 DNR [id number]  Dustin Flock, MD Inpatient   08/18/2019 2143 08/20/2019 1256 Full Code [id number]  Lance Coon, MD Inpatient   Advance Care Planning Activity    Advance Directive Documentation     Most Recent Value  Type of Advance Directive  Healthcare Power of Attorney, Living will  Pre-existing  out of facility DNR order (yellow form or pink MOST form)  -  "MOST" Form in Place?   -      TOTAL TIME TAKING CARE OF THIS PATIENT: 35 minutes.    Vaughan Basta M.D on 09/26/2019 at 11:25 AM  Between 7am to 6pm - Pager - [phone]  After 6pm go to [email or web address] - password [email or web address] [facility]  Office  [phone]  CC: Primary care physician; Einar Pheasant, MD   Note: This dictation was prepared with Dragon dictation along with smaller phrase technology. Any transcriptional errors that result from this process are unintentional.

## 2019-09-26 NOTE — Progress Notes (Signed)
Notified by CSW Evette Cristal that family has chosen for patient to discharge today to [facility] inpatient unit as a bed is available there. Referral and the hospice home notified.  Flo Shanks BSN, RN, [facility] [phone]

## 2019-09-26 NOTE — TOC Transition Note (Signed)
Transition of Care (TOC) - CM/SW Discharge Note   Patient Details  Name: Denise M. Garrison MRN: [id number] Date of Birth: July 06, 1923  Transition of Care [facility]) CM/SW Contact:  Ross Ludwig, LCSW Phone Number: 09/26/2019, 12:07 PM   Clinical Narrative:     CSW spoke with Cassandra at [facility], [phone], who said they can accept patient today.  Patient to be d/c'ed today to [facility].  Patient and family agreeable to plans will transport via ems RN to call report to [phone].  CSW updated patient's daughter [name] at [phone].   Final next level of care: [place] Barriers to Discharge: Barriers Resolved   Patient Goals and CMS Choice Patient states their goals for this hospitalization and ongoing recovery are:: To go to [facility] facility for end of life care. CMS Medicare.gov Compare Post Acute Care list provided to:: Patient Represenative (must comment) Choice offered to / list presented to : Adult Children  Discharge Placement              Patient chooses bed at: Other - please specify in the comment section below:([facility]) Patient to be transferred to facility by: [facility] Name of family member notified: Daughter [name] [place] Patient and family notified of of transfer: 09/26/19  Discharge Plan and [place] [facility] [place] Choice: Hospice                               Social Determinants of Health (SDOH) Interventions     Readmission Risk Interventions No flowsheet data found.

## 2019-09-26 NOTE — Progress Notes (Signed)
Daily Progress Note   Patient Name: Denise Garrison       Date: 09/26/2019 DOB: 1923-01-02  Age: 85 y.o. MRN#: [id number] Attending Physician: Vaughan Basta, * Primary Care Physician: Einar Pheasant, MD Admit Date: 09/19/2019  Reason for Consultation/Follow-up: Establishing goals of care  Subjective: Patient is resting in bed. She denies complaint. She denies hunger or thirst. She will be transferred to [facility] today.     Length of Stay: 7  Current Medications: Scheduled Meds:  . diltiazem  60 mg Oral Q8H  . sodium chloride flush  3 mL Intravenous Q12H    Continuous Infusions:   PRN Meds: acetaminophen **OR** acetaminophen, antiseptic oral rinse, glycopyrrolate **OR** glycopyrrolate **OR** glycopyrrolate, haloperidol **OR** haloperidol **OR** haloperidol lactate, LORazepam **OR** LORazepam **OR** LORazepam, morphine injection, [DISCONTINUED] ondansetron **OR** ondansetron (ZOFRAN) IV, ondansetron **OR** [DISCONTINUED] ondansetron (ZOFRAN) IV, polyvinyl alcohol, sodium chloride flush  Physical Exam Pulmonary:     Effort: Pulmonary effort is normal.  Skin:    General: Skin is warm and dry.  Neurological:     Mental Status: She is alert.             Vital Signs: BP 124/73 (BP Location: Left Arm)   Pulse (!) 117   Temp 98.7 F (37.1 C) (Oral)   Resp (!) 22   Ht 5\' 5"  (1.651 m)   Wt 50.4 kg   SpO2 (!) 87%   BMI 18.50 kg/m  SpO2: SpO2: (!) 87 % O2 Device: O2 Device: Room Air O2 Flow Rate: O2 Flow Rate (L/min): 1 L/min  Intake/output summary:   Intake/Output Summary (Last 24 hours) at 09/26/2019 1203 Last data filed at 09/25/2019 2256 Gross per 24 hour  Intake -  Output 1 ml  Net -1 ml   LBM: Last BM Date: (uta) Baseline Weight: Weight: 54.4  kg Most recent weight: Weight: 50.4 kg       Palliative Assessment/Data: 30% at best      Patient Active Problem List   Diagnosis Date Noted  . UTI (urinary tract infection) 08/18/2019  . Bilateral renal masses 08/18/2019  . Closed compression fracture of L1 vertebra ([place]) 08/18/2019  . Vitamin D deficiency 07/05/2017  . A-fib ([place]) 01/08/2017  . Thyroid fullness 03/09/2014  . Hypercholesterolemia 11/11/2012  . Osteoporosis 11/11/2012  . B12 deficiency 11/11/2012  .  Cognitive dysfunction 11/11/2012    Palliative Care Assessment & Plan    Recommendations/Plan:  Comfort care. Transfer to hospice facility in [facility] today.     Code Status:    Code Status Orders  (From admission, onward)         Start     Ordered   09/20/19 0119  Do not attempt resuscitation (DNR)  Continuous    Question Answer Comment  In the event of cardiac or respiratory ARREST Do not call a "code blue"   In the event of cardiac or respiratory ARREST Do not perform Intubation, CPR, defibrillation or ACLS   In the event of cardiac or respiratory ARREST Use medication by any route, position, wound care, and other measures to relive pain and suffering. May use oxygen, suction and manual treatment of airway obstruction as needed for comfort.      09/20/19 0118        Code Status History    Date Active Date Inactive Code Status Order ID Comments User Context   08/20/2019 1256 08/21/2019 1857 DNR [id number]  Dustin Flock, MD Inpatient   08/18/2019 2143 08/20/2019 1256 Full Code [id number]  Lance Coon, MD Inpatient   Advance Care Planning Activity    Advance Directive Documentation     Most Recent Value  Type of Advance Directive  Healthcare Power of Attorney, Living will  Pre-existing out of facility DNR order (yellow form or pink MOST form)  -  "MOST" Form in Place?  -       Prognosis:   < 2 weeks Poor PO intake. Hypoglycemia requiring D5% drip during admission. Aspiration PNA.  Family would like comfort feeds. Patient does not want to wear O2. Family wants to stop abx.    Discharge Planning:  Hospice facility   Thank you for allowing the Palliative Medicine Team to assist in the care of this patient.   Total Time 15 min Prolonged Time Billed no      Greater than 50%  of this time was spent counseling and coordinating care related to the above assessment and plan.  Asencion Gowda, NP  Please contact Palliative Medicine Team phone at [phone] for questions and concerns.

## 2019-09-26 NOTE — Care Management Important Message (Signed)
Important Message  Patient Details  Name: Denise Garrison MRN: [id number] Date of Birth: 1922/12/20   Medicare Important Message Given:  Yes     Dannette Barbara 09/26/2019, 2:14 PM

## 2019-09-26 NOTE — Discharge Instructions (Signed)
Comfort care  °

## 2019-10-13 DEATH — deceased

## 2020-03-11 ENCOUNTER — Ambulatory Visit: Payer: PPO | Admitting: Urology

## 2020-04-29 IMAGING — DX DG CHEST 1V PORT
2 series · 2 of 2 positions shown · non-contrast
Comparison: Rib series 03/07/2009

CLINICAL DATA: Patient with history of back fracture.

EXAM:
PORTABLE CHEST 1 VIEW

[chest ap (1 of 2)]
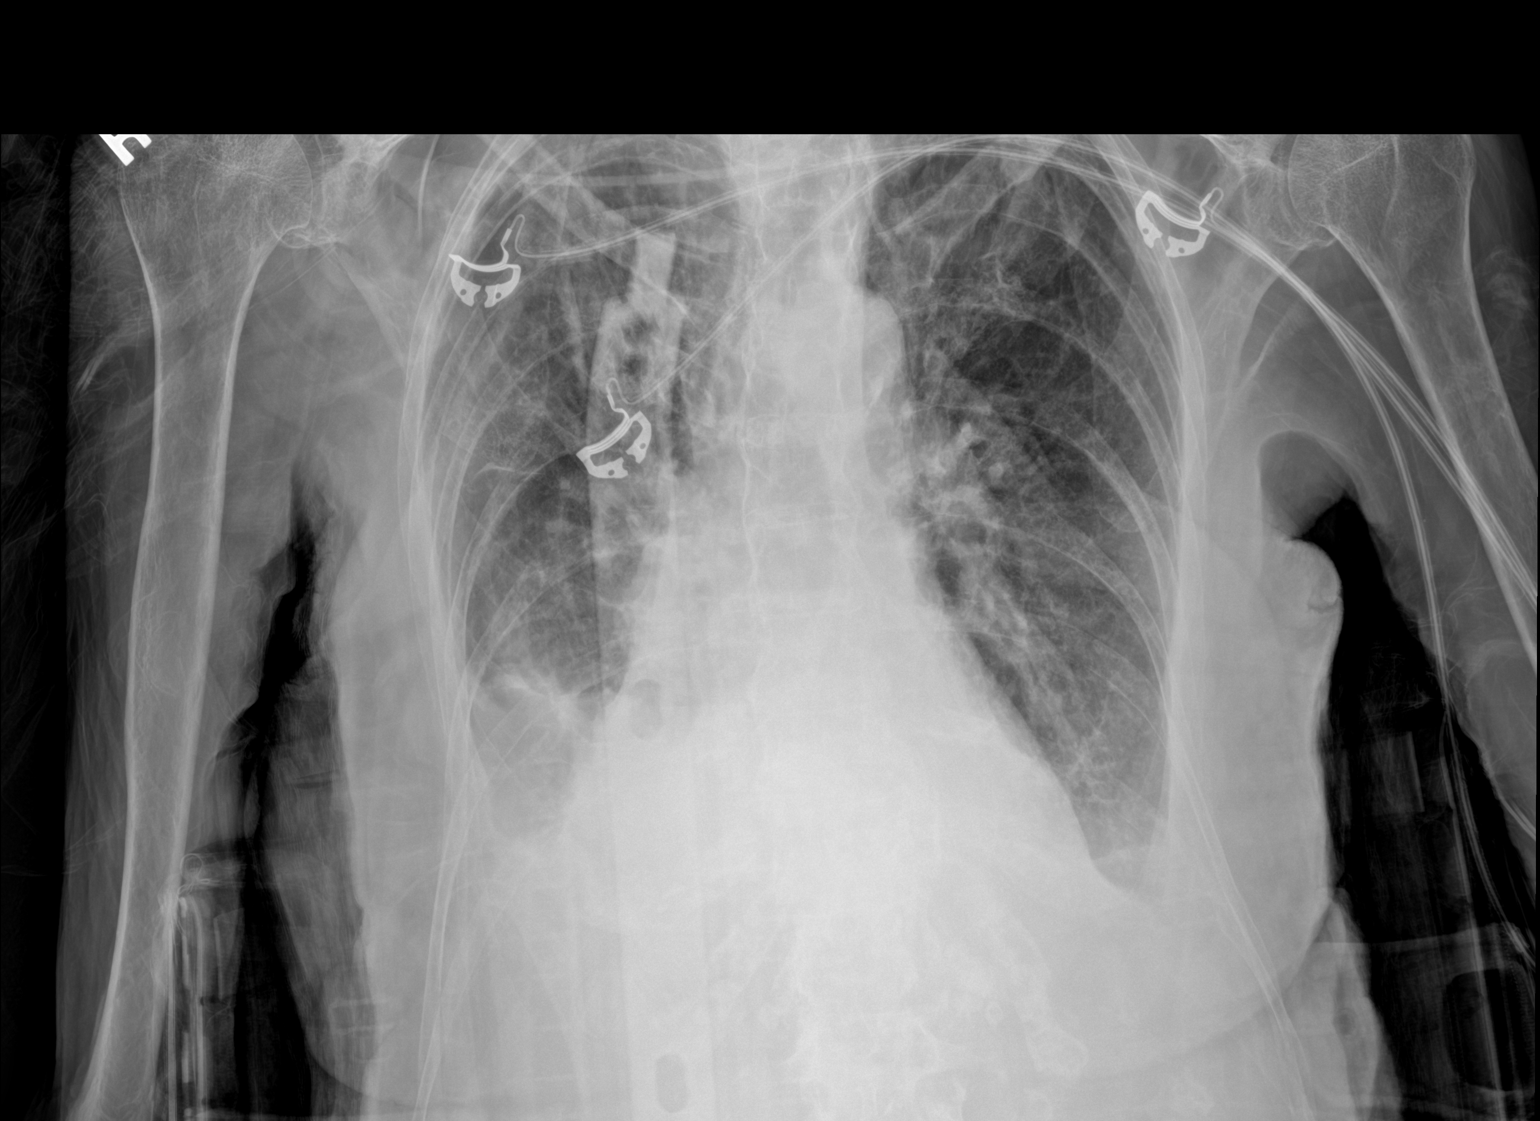

[chest ap (2 of 2)]
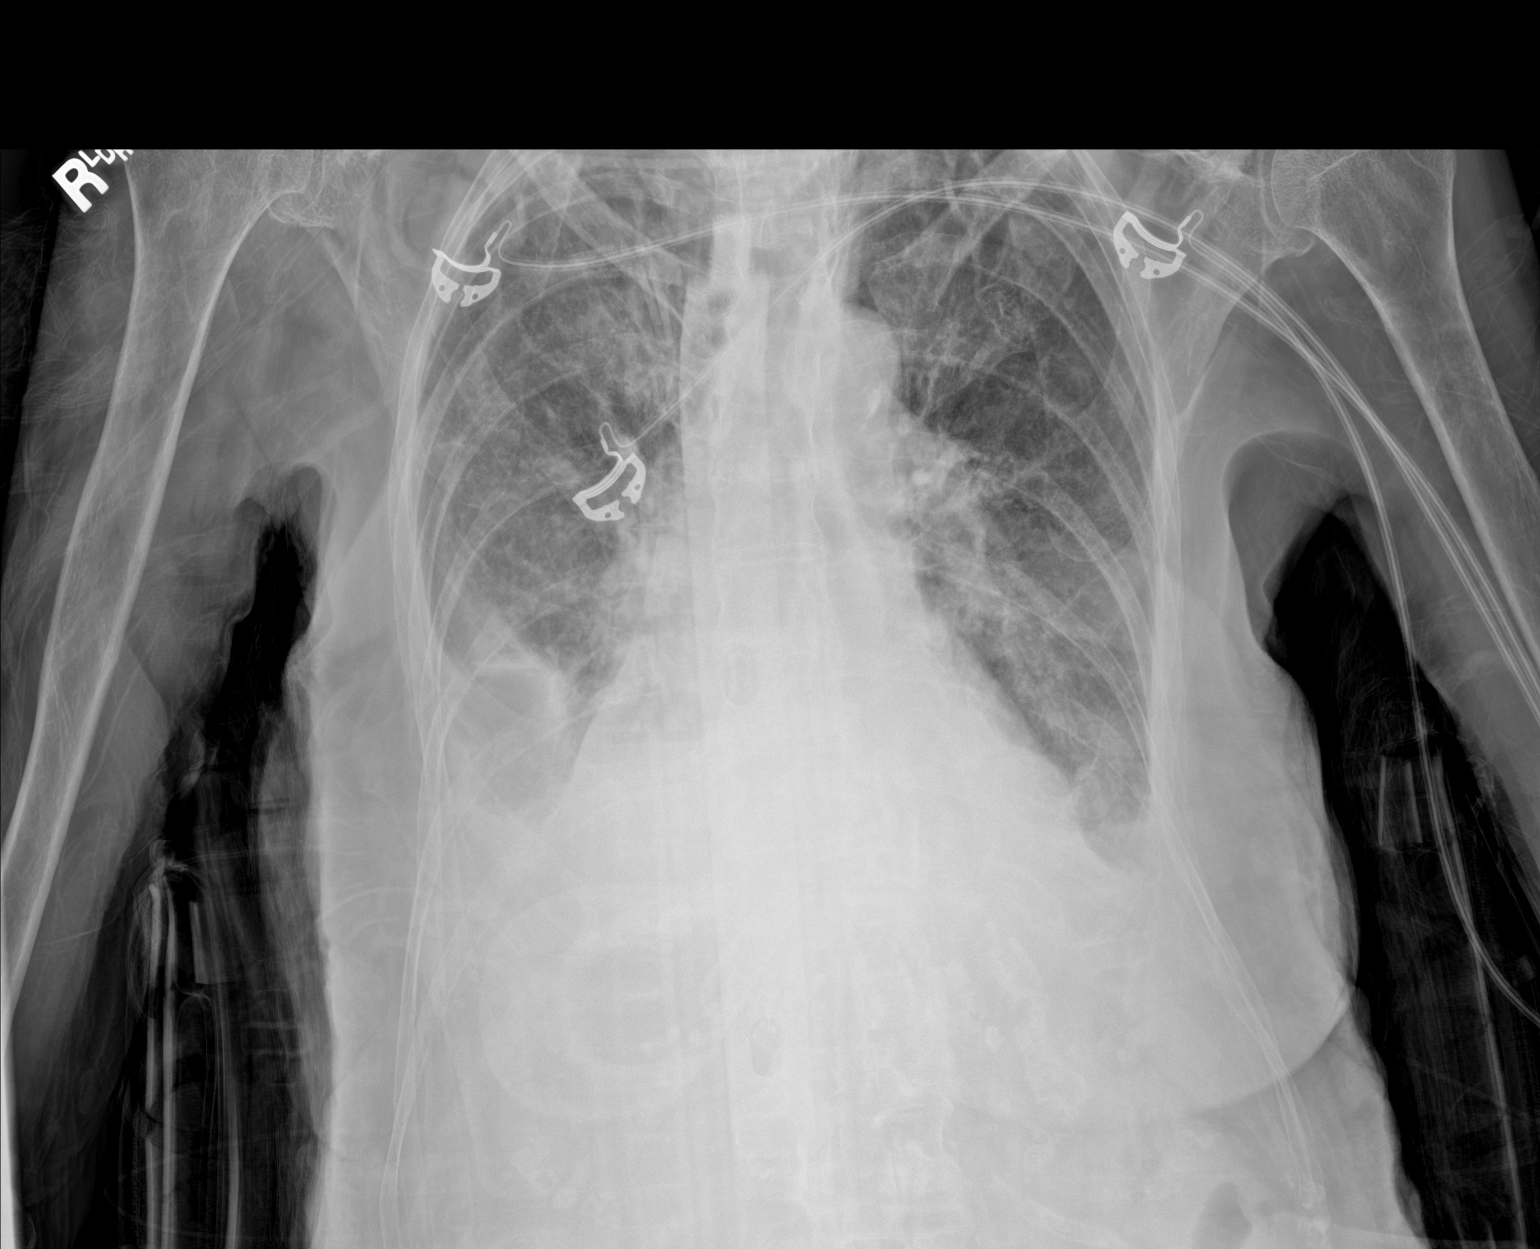

[2 of 2 positions shown; findings below may reference images not displayed]

FINDINGS: Cardiac contours upper limits of normal. Small bilateral pleural
effusions, right-greater-than-left. Right basilar consolidation.
Biapical pleuroparenchymal thickening left greater than right.
Thoracic spine degenerative changes. Monitoring leads overlie the
patient.
IMPRESSION: Patchy consolidation within the right lower lung with
right-greater-than-left pleural effusions. Opacities may represent
atelectasis or infection.
